# Patient Record
Sex: Female | Born: 1995 | Race: Black or African American | Hispanic: No | Marital: Single | Smoking: Current some day smoker
Health system: Southern US, Community
[De-identification: ages and names within clinical notes are randomized; demographics above are authoritative.]

## PROBLEM LIST (undated history)

## (undated) DIAGNOSIS — L0232 Furuncle of buttock: Secondary | ICD-10-CM

## (undated) HISTORY — PX: NO PAST SURGERIES: SHX2092

---
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
## 2017-01-20 ENCOUNTER — Ambulatory Visit (INDEPENDENT_AMBULATORY_CARE_PROVIDER_SITE_OTHER): Payer: Medicaid Other | Admitting: Family Medicine

## 2017-01-20 VITALS — BP 102/64 | HR 85 | Temp 98.1°F | Wt 158.0 lb

## 2017-01-20 DIAGNOSIS — Z23 Encounter for immunization: Secondary | ICD-10-CM

## 2017-01-20 DIAGNOSIS — O0001 Abdominal pregnancy with intrauterine pregnancy: Secondary | ICD-10-CM | POA: Diagnosis not present

## 2017-01-20 NOTE — Patient Instructions (Signed)

## 2017-01-20 NOTE — Progress Notes (Addendum)
Sara RoanLauren K Kelly is a 21 y.o. G1P0 at 6756w5d here for routine follow up.  She reports good fetal movement. No vaginal bleeding, LOF, contractions.  See flow sheet for details.  Fetus appears head down by bedside ultrasound  A/P: Pregnancy at 6156w5d.  Doing well.   Pregnancy issues include: THC use during pregnancy, denies any recent use.  She had an f/u anatomy scan which still had inadequate views of the ductal arch and orbit with no recommendation for follow up imaging  Infant feeding choice: breast and bottle Contraception choice: unsure, still thinking Infant circumcision desired: not applicable  Tdap was given today.  Preterm labor and fetal movement precautions reviewed. Safe sleep discussed. Follow up 2 weeks.

## 2017-02-04 ENCOUNTER — Encounter: Payer: Self-pay | Admitting: Internal Medicine

## 2017-02-04 NOTE — Progress Notes (Deleted)
Yvetta Josem KaufmannK Sarff is a 21 y.o. G***P*** at 4140w6d for routine follow up.  She reports***.  See flow sheet for details.  A/P: Pregnancy at 3840w6d.  Doing well.   Pregnancy issues include***  Infant feeding choice*** Contraception choice*** Infant circumcision desired {Response; yes/no/na:63}  Tdap{was/was not:19854::"was"} given today. GBS/GC/CZ testing {was/was not:19854::"was"} performed today.  Preterm labor precautions reviewed. Safe sleep discussed. Kick counts reviewed. Follow up 2 weeks.

## 2017-02-08 ENCOUNTER — Encounter: Payer: Self-pay | Admitting: Internal Medicine

## 2017-02-19 ENCOUNTER — Encounter: Payer: Medicaid Other | Admitting: Internal Medicine

## 2017-02-26 ENCOUNTER — Ambulatory Visit (INDEPENDENT_AMBULATORY_CARE_PROVIDER_SITE_OTHER): Payer: Medicaid Other | Admitting: Student

## 2017-02-26 ENCOUNTER — Encounter: Payer: Self-pay | Admitting: Student

## 2017-02-26 ENCOUNTER — Encounter: Payer: Medicaid Other | Admitting: Family Medicine

## 2017-02-26 ENCOUNTER — Other Ambulatory Visit (HOSPITAL_COMMUNITY)
Admission: RE | Admit: 2017-02-26 | Discharge: 2017-02-26 | Disposition: A | Discharge status: Home / Self Care | Payer: Medicaid Other | Source: Ambulatory Visit | Attending: Family Medicine | Admitting: Family Medicine

## 2017-02-26 VITALS — BP 102/58 | HR 85 | Temp 98.5°F | Ht 63.0 in | Wt 171.6 lb

## 2017-02-26 DIAGNOSIS — Z114 Encounter for screening for human immunodeficiency virus [HIV]: Secondary | ICD-10-CM

## 2017-02-26 DIAGNOSIS — Z3403 Encounter for supervision of normal first pregnancy, third trimester: Secondary | ICD-10-CM | POA: Diagnosis present

## 2017-02-26 DIAGNOSIS — Z3A38 38 weeks gestation of pregnancy: Secondary | ICD-10-CM | POA: Diagnosis not present

## 2017-02-26 DIAGNOSIS — Z113 Encounter for screening for infections with a predominantly sexual mode of transmission: Secondary | ICD-10-CM

## 2017-02-26 LAB — OB RESULTS CONSOLE GBS: GBS: NEGATIVE

## 2017-02-26 LAB — OB RESULTS CONSOLE GC/CHLAMYDIA: Gonorrhea: NEGATIVE

## 2017-02-26 NOTE — Progress Notes (Signed)
Sara Kelly is a 21 y.o. G1P0 at 3945w0d for routine follow up. Patient is here with her mother. She missed the last 2 appointments due to insurance issue. She denies pain or contraction, unusual vaginal discharge, gush or leak of fluid, vaginal bleeding, dysuria or fever. Fetal movement is present and is at baseline.   See flow sheet for details. Vitals:   02/26/17 1705  BP: (!) 102/58  Pulse: 85  Temp: 98.5 F (36.9 C)  TempSrc: Oral  SpO2: 98%  Weight: 171 lb 9.6 oz (77.8 kg)  Height: 5\' 3"  (1.6 m)  PELVIC:   Presentation vertex on [place]. Fundal height at 39 weeks External genitalia: normal without surrounding skin lesion, obvious discharge or bleeeding.  Speculum: pink vaginal mucosa, ruggated. No pooling or bleeding. Cervix visually closed and thick Manual: Cervix closed and thick  A/P: Pregnancy at 1445w0d.  Doing well.   Pregnancy issues include:  -Teen pregnancy  Infant feeding choice: breast and bottle Contraception choice: undecided Infant circumcision desired not applicable GBS/GC/CZ results were obtained today.   Patient did not have a second trimester labs including CBC, HIV & RPR. Unfortunately the lab is closed at this time. Advised patient to return on Monday for the lab draw. Future labs were ordered Term labor symptoms and general obstetric precautions including but not limited to vaginal bleeding, contractions, leaking of fluid and fetal movement were reviewed in detail with the patient. Follow up for prenatal visit in a week.

## 2017-02-26 NOTE — Patient Instructions (Signed)

## 2017-03-01 LAB — CERVICOVAGINAL ANCILLARY ONLY
CHLAMYDIA, DNA PROBE: NEGATIVE
NEISSERIA GONORRHEA: NEGATIVE
TRICH (WINDOWPATH): NEGATIVE

## 2017-03-02 ENCOUNTER — Other Ambulatory Visit: Payer: Medicaid Other

## 2017-03-02 DIAGNOSIS — Z3403 Encounter for supervision of normal first pregnancy, third trimester: Secondary | ICD-10-CM

## 2017-03-02 DIAGNOSIS — Z113 Encounter for screening for infections with a predominantly sexual mode of transmission: Secondary | ICD-10-CM

## 2017-03-02 DIAGNOSIS — Z114 Encounter for screening for human immunodeficiency virus [HIV]: Secondary | ICD-10-CM

## 2017-03-03 ENCOUNTER — Telehealth: Payer: Self-pay | Admitting: Student

## 2017-03-03 LAB — CBC
HEMATOCRIT: 30.8 % — AB (ref 34.0–46.6)
HEMOGLOBIN: 9.6 g/dL — AB (ref 11.1–15.9)
MCH: 29.9 pg (ref 26.6–33.0)
MCHC: 31.2 g/dL — AB (ref 31.5–35.7)
MCV: 96 fL (ref 79–97)
Platelets: 204 10*3/uL (ref 150–379)
RBC: 3.21 x10E6/uL — AB (ref 3.77–5.28)
RDW: 13.9 % (ref 12.3–15.4)
WBC: 5.9 10*3/uL (ref 3.4–10.8)

## 2017-03-03 LAB — RPR: RPR Ser Ql: NONREACTIVE

## 2017-03-03 LAB — HIV ANTIBODY (ROUTINE TESTING W REFLEX): HIV SCREEN 4TH GENERATION: NONREACTIVE

## 2017-03-03 NOTE — Telephone Encounter (Signed)
Called and discussed about her Hgb level, which is 9.6. She says she hasn't been taking iron. I encouraged her to restart taking it. Advised her to take with orange juice. Patient voices understanding and appreciates the call.

## 2017-03-05 ENCOUNTER — Encounter: Payer: Self-pay | Admitting: Student

## 2017-03-05 ENCOUNTER — Ambulatory Visit (INDEPENDENT_AMBULATORY_CARE_PROVIDER_SITE_OTHER): Payer: Medicaid Other | Admitting: Student

## 2017-03-05 VITALS — BP 108/72 | HR 102 | Temp 97.8°F | Wt 169.2 lb

## 2017-03-05 DIAGNOSIS — O99013 Anemia complicating pregnancy, third trimester: Secondary | ICD-10-CM

## 2017-03-05 DIAGNOSIS — Z3403 Encounter for supervision of normal first pregnancy, third trimester: Secondary | ICD-10-CM

## 2017-03-05 LAB — CULTURE, BETA STREP (GROUP B ONLY): Strep Gp B Culture: NEGATIVE

## 2017-03-05 NOTE — Patient Instructions (Signed)

## 2017-03-05 NOTE — Progress Notes (Signed)
Sara RoanLauren K Treinen is a 21 y.o. G1P0 at 445w0d for routine follow up.  She reports occasional contraction once early this morning and 5 minutes ago. They lasted 5 seconds. She reports fetal movement as usual. Denies vaginal bleeding, gush or leak of fluid, unusual discharge, dysuria or fever. She is taking prenatal vitamin and iron pills.   See flow sheet for details.  A/P: Pregnancy at 645w0d.  Doing well.   Pregnancy issues include: Anemia. Hemoglobin 9.6 about a week ago. -She resumed her iron pills  Infant feeding choice: Breast and bottle Contraception choice: Nexplanon Infant circumcision desired not applicable GBS/GC/CZ results were reviewed today. Labor symptoms and general obstetric precautions including but not limited to vaginal bleeding, contractions, leaking of fluid and fetal movement were reviewed in detail with the patient.

## 2017-03-12 ENCOUNTER — Ambulatory Visit (INDEPENDENT_AMBULATORY_CARE_PROVIDER_SITE_OTHER): Payer: Medicaid Other | Admitting: Student

## 2017-03-12 ENCOUNTER — Telehealth (HOSPITAL_COMMUNITY): Payer: Self-pay | Admitting: *Deleted

## 2017-03-12 ENCOUNTER — Encounter: Payer: Self-pay | Admitting: Student

## 2017-03-12 VITALS — BP 108/72 | HR 94 | Temp 98.4°F | Wt 171.0 lb

## 2017-03-12 DIAGNOSIS — O0001 Abdominal pregnancy with intrauterine pregnancy: Secondary | ICD-10-CM | POA: Diagnosis present

## 2017-03-12 LAB — POCT FERNING: Ferning, POC: NEGATIVE

## 2017-03-12 NOTE — Patient Instructions (Signed)
This nice to meet you again! We have scheduled your test at [facility] for Monday, 03/15/2017 at 10:40 AM. Please arrive early. Mild, please go to [facility] as soon as possible if you have frequent contractions, gush or leak of fluid, vaginal bleeding, baby not moving or other symptoms concerning to you.   Third Trimester of Pregnancy The third trimester is from week 29 through week 42, months 7 through 9. This trimester is when your unborn baby (fetus) is growing very fast. At the end of the ninth month, the unborn baby is about 20 inches in length. It weighs about 6-10 pounds. Follow these instructions at home:  Avoid all smoking, herbs, and alcohol. Avoid drugs not approved by your doctor.  Do not use any tobacco products, including cigarettes, chewing tobacco, and electronic cigarettes. If you need help quitting, ask your doctor. You may get counseling or other support to help you quit.  Only take medicine as told by your doctor. Some medicines are safe and some are not during pregnancy.  Exercise only as told by your doctor. Stop exercising if you start having cramps.  Eat regular, healthy meals.  Wear a good support bra if your breasts are tender.  Do not use hot tubs, steam rooms, or saunas.  Wear your seat belt when driving.  Avoid raw meat, uncooked cheese, and liter boxes and soil used by cats.  Take your prenatal vitamins.  Take 1500-2000 milligrams of calcium daily starting at the 20th week of pregnancy until you deliver your baby.  Try taking medicine that helps you poop (stool softener) as needed, and if your doctor approves. Eat more fiber by eating fresh fruit, vegetables, and whole grains. Drink enough fluids to keep your pee (urine) clear or pale yellow.  Take warm water baths (sitz baths) to soothe pain or discomfort caused by hemorrhoids. Use hemorrhoid cream if your doctor approves.  If you have puffy, bulging veins (varicose veins), wear support  hose. Raise (elevate) your feet for 15 minutes, 3-4 times a day. Limit salt in your diet.  Avoid heavy lifting, wear low heels, and sit up straight.  Rest with your legs raised if you have leg cramps or low back pain.  Visit your dentist if you have not gone during your pregnancy. Use a soft toothbrush to brush your teeth. Be gentle when you floss.  You can have sex (intercourse) unless your doctor tells you not to.  Do not travel far distances unless you must. Only do so with your doctor's approval.  Take prenatal classes.  Practice driving to the hospital.  Pack your hospital bag.  Prepare the baby's room.  Go to your doctor visits. Get help if:  You are not sure if you are in labor or if your water has broken.  You are dizzy.  You have mild cramps or pressure in your lower belly (abdominal).  You have a nagging pain in your belly area.  You continue to feel sick to your stomach (nauseous), throw up (vomit), or have watery poop (diarrhea).  You have bad smelling fluid coming from your vagina.  You have pain with peeing (urination). Get help right away if:  You have a fever.  You are leaking fluid from your vagina.  You are spotting or bleeding from your vagina.  You have severe belly cramping or pain.  You lose or gain weight rapidly.  You have trouble catching your breath and have chest pain.  You notice sudden or extreme  puffiness (swelling) of your face, hands, ankles, feet, or legs.  You have not felt the baby move in over an hour.  You have severe headaches that do not go away with medicine.  You have vision changes. This information is not intended to replace advice given to you by your health care provider. Make sure you discuss any questions you have with your health care provider. Document Released: 10/07/2009 Document Revised: 12/19/2015 Document Reviewed: 09/13/2012 Elsevier Interactive Patient Education  2017 [employer].

## 2017-03-12 NOTE — Progress Notes (Signed)
Sara Kelly is a 21 y.o. G1P0 at 102w0d for routine follow up.  She reports that fetal movement is as usual. Denies cramping pain. She reports some whitish discharge. Denies itching or odor to it. Unsure about leakage of fluid. Denies vaginal bleeding, dysuria or fever. Eating and drinking as usual. Denies headache or vision changes.   See flow sheet for details. Vitals:   03/12/17 1045  BP: 108/72  Pulse: 94  Temp: 98.4 F (36.9 C)  Weight: 171 lb (77.6 kg)  GEN: appears well, no apparent distress. Eyes: conjunctiva without injection, sclera anicteric CVS: RRR, nl s1 & s2, no murmurs, no edema RESP: no IWOB Pelvic exam:  Inspection: noted some whitish discharge from vaginal. No lesion Speculum without pooling or bleeding but whitish discharge from cervix.  Cervical exam: cervix about fingertip and thick.  SKIN: no apparent skin lesion ENDO: negative thyromegally PSYCH: euthymic mood with congruent affect   A/P: Pregnancy at [redacted]w[redacted]d.  Doing well.   Pregnancy issues include:  Teen pregnancy Anemia in pregnancy: taking iron and prenatal vitamin diligently.   Infant feeding choice: Breast and bottle Contraception choice: Nexplanon Infant circumcision desired not applicable GBS/GC/CZ results  were reviewed today.   Labor precautions reviewed. Kick counts reviewed. Twice weekly testing scheduled.  Induction scheduled for 41+ weeks on 03/20/2017. She will be [redacted]w[redacted]d. This is the soonest available.

## 2017-03-12 NOTE — Telephone Encounter (Signed)
Preadmission screen  

## 2017-03-14 ENCOUNTER — Inpatient Hospital Stay (HOSPITAL_COMMUNITY)
Admission: AD | Admit: 2017-03-14 | Discharge: 2017-03-16 | DRG: 775 | Disposition: A | Discharge status: Home / Self Care | Payer: Medicaid Other | Source: Ambulatory Visit | Attending: Obstetrics and Gynecology | Admitting: Obstetrics and Gynecology

## 2017-03-14 ENCOUNTER — Inpatient Hospital Stay (HOSPITAL_COMMUNITY): Payer: Medicaid Other | Admitting: Anesthesiology

## 2017-03-14 ENCOUNTER — Encounter (HOSPITAL_COMMUNITY): Payer: Self-pay

## 2017-03-14 DIAGNOSIS — Z880 Allergy status to penicillin: Secondary | ICD-10-CM | POA: Diagnosis not present

## 2017-03-14 DIAGNOSIS — D649 Anemia, unspecified: Secondary | ICD-10-CM | POA: Diagnosis present

## 2017-03-14 DIAGNOSIS — Z3A40 40 weeks gestation of pregnancy: Secondary | ICD-10-CM

## 2017-03-14 DIAGNOSIS — O9902 Anemia complicating childbirth: Secondary | ICD-10-CM | POA: Diagnosis present

## 2017-03-14 DIAGNOSIS — O4292 Full-term premature rupture of membranes, unspecified as to length of time between rupture and onset of labor: Secondary | ICD-10-CM | POA: Diagnosis present

## 2017-03-14 DIAGNOSIS — Z87891 Personal history of nicotine dependence: Secondary | ICD-10-CM | POA: Diagnosis not present

## 2017-03-14 LAB — CBC
HCT: 28.8 % — ABNORMAL LOW (ref 36.0–46.0)
Hemoglobin: 9.5 g/dL — ABNORMAL LOW (ref 12.0–15.0)
MCH: 30.6 pg (ref 26.0–34.0)
MCHC: 33 g/dL (ref 30.0–36.0)
MCV: 92.9 fL (ref 78.0–100.0)
PLATELETS: 177 10*3/uL (ref 150–400)
RBC: 3.1 MIL/uL — AB (ref 3.87–5.11)
RDW: 14.8 % (ref 11.5–15.5)
WBC: 8.2 10*3/uL (ref 4.0–10.5)

## 2017-03-14 LAB — TYPE AND SCREEN
ABO/RH(D): O POS
ANTIBODY SCREEN: NEGATIVE

## 2017-03-14 LAB — POCT FERN TEST: POCT Fern Test: POSITIVE

## 2017-03-14 LAB — ABO/RH: ABO/RH(D): O POS

## 2017-03-14 LAB — RPR: RPR: NONREACTIVE

## 2017-03-14 MED ORDER — LACTATED RINGERS IV SOLN: 500.0000 mL | Freq: Once | INTRAVENOUS | Status: DC

## 2017-03-14 MED ORDER — LACTATED RINGERS IV SOLN: 500.0000 mL | INTRAVENOUS | Status: DC | PRN

## 2017-03-14 MED ORDER — DIPHENHYDRAMINE HCL 50 MG/ML IJ SOLN: 12.5000 mg | INTRAMUSCULAR | Status: DC | PRN

## 2017-03-14 MED ORDER — FENTANYL CITRATE (PF) 100 MCG/2ML IJ SOLN: 50.0000 ug | INTRAMUSCULAR | Status: DC | PRN

## 2017-03-14 MED ORDER — LIDOCAINE HCL (PF) 1 % IJ SOLN
30.0000 mL | INTRAMUSCULAR | Status: DC | PRN
  Filled 2017-03-14: qty 30

## 2017-03-14 MED ORDER — PHENYLEPHRINE 40 MCG/ML (10ML) SYRINGE FOR IV PUSH (FOR BLOOD PRESSURE SUPPORT)
80.0000 ug | PREFILLED_SYRINGE | INTRAVENOUS | Status: DC | PRN
  Filled 2017-03-14: qty 5

## 2017-03-14 MED ORDER — ONDANSETRON HCL 4 MG/2ML IJ SOLN: 4.0000 mg | Freq: Four times a day (QID) | INTRAMUSCULAR | Status: DC | PRN

## 2017-03-14 MED ORDER — OXYTOCIN 40 UNITS IN LACTATED RINGERS INFUSION - SIMPLE MED
2.5000 [IU]/h | INTRAVENOUS | Status: DC
  Administered 2017-03-14: 2.5 [IU]/h via INTRAVENOUS
  Filled 2017-03-14: qty 1000

## 2017-03-14 MED ORDER — EPHEDRINE 5 MG/ML INJ
10.0000 mg | INTRAVENOUS | Status: DC | PRN
  Filled 2017-03-14: qty 2

## 2017-03-14 MED ORDER — FENTANYL 2.5 MCG/ML BUPIVACAINE 1/10 % EPIDURAL INFUSION (WH - ANES): 14.0000 mL/h | INTRAMUSCULAR | Status: DC | PRN

## 2017-03-14 MED ORDER — SOD CITRATE-CITRIC ACID 500-334 MG/5ML PO SOLN: 30.0000 mL | ORAL | Status: DC | PRN

## 2017-03-14 MED ORDER — TERBUTALINE SULFATE 1 MG/ML IJ SOLN
0.2500 mg | Freq: Once | INTRAMUSCULAR | Status: DC | PRN
  Filled 2017-03-14: qty 1

## 2017-03-14 MED ORDER — FLEET ENEMA 7-19 GM/118ML RE ENEM: 1.0000 | ENEMA | RECTAL | Status: DC | PRN

## 2017-03-14 MED ORDER — FENTANYL 2.5 MCG/ML BUPIVACAINE 1/10 % EPIDURAL INFUSION (WH - ANES)
14.0000 mL/h | INTRAMUSCULAR | Status: DC | PRN
  Administered 2017-03-14: 14 mL/h via EPIDURAL
  Filled 2017-03-14: qty 100

## 2017-03-14 MED ORDER — OXYTOCIN BOLUS FROM INFUSION
500.0000 mL | Freq: Once | INTRAVENOUS | Status: AC
  Administered 2017-03-14: 500 mL via INTRAVENOUS

## 2017-03-14 MED ORDER — LACTATED RINGERS IV SOLN
INTRAVENOUS | Status: DC
  Administered 2017-03-14: 17:00:00 via INTRAVENOUS
  Administered 2017-03-14: 125 mL/h via INTRAVENOUS
  Administered 2017-03-14: 22:00:00 via INTRAVENOUS

## 2017-03-14 MED ORDER — PHENYLEPHRINE 40 MCG/ML (10ML) SYRINGE FOR IV PUSH (FOR BLOOD PRESSURE SUPPORT)
80.0000 ug | PREFILLED_SYRINGE | INTRAVENOUS | Status: DC | PRN
  Filled 2017-03-14: qty 10
  Filled 2017-03-14: qty 5

## 2017-03-14 MED ORDER — ACETAMINOPHEN 325 MG PO TABS: 650.0000 mg | ORAL_TABLET | ORAL | Status: DC | PRN

## 2017-03-14 MED ORDER — OXYCODONE-ACETAMINOPHEN 5-325 MG PO TABS: 2.0000 | ORAL_TABLET | ORAL | Status: DC | PRN

## 2017-03-14 MED ORDER — LIDOCAINE HCL (PF) 1 % IJ SOLN
INTRAMUSCULAR | Status: DC | PRN
  Administered 2017-03-14 (×2): 4 mL

## 2017-03-14 MED ORDER — OXYCODONE-ACETAMINOPHEN 5-325 MG PO TABS: 1.0000 | ORAL_TABLET | ORAL | Status: DC | PRN

## 2017-03-14 MED ORDER — OXYTOCIN 40 UNITS IN LACTATED RINGERS INFUSION - SIMPLE MED
1.0000 m[IU]/min | INTRAVENOUS | Status: DC
  Administered 2017-03-14: 2 m[IU]/min via INTRAVENOUS

## 2017-03-14 NOTE — H&P (Signed)
LABOR AND DELIVERY ADMISSION HISTORY AND PHYSICAL NOTE  Sara Kelly is a 21 y.o. female G1P0 with IUP at [redacted]w[redacted]d by LMP presenting for SROM at 8pm on 8/18.   She reports positive fetal movement and leaking fluid. She denies vaginal bleeding. She denies feeling contractions. Feels baby kicking. No lower abdominal pressure.   Prenatal History/Complications: Anemia Teen pregnancy  Past Medical History: Past Medical History:  Diagnosis Date  . Boil of buttock     Past Surgical History: Past Surgical History:  Procedure Laterality Date  . NO PAST SURGERIES      Obstetrical History: OB History    Gravida Para Term Preterm AB Living   1             SAB TAB Ectopic Multiple Live Births                  Social History: Social History   Social History  . Marital status: Single    Spouse name: N/A  . Number of children: N/A  . Years of education: N/A   Social History Main Topics  . Smoking status: Former Smoker    Types: Cigars    Quit date: 07/27/2016  . Smokeless tobacco: Never Used  . Alcohol use No     Comment: quit with +preg  . Drug use: No  . Sexual activity: Yes   Other Topics Concern  . None   Social History Narrative  . None    Family History: Family History  Problem Relation Age of Onset  . Diabetes Sister     Allergies: Allergies  Allergen Reactions  . Amoxicillin Hives  . Penicillins Hives    Prescriptions Prior to Admission  Medication Sig Dispense Refill Last Dose  . ferrous sulfate 325 (65 FE) MG tablet Take 1 tablet (325 mg total) by mouth daily. 90 tablet 3 Past Week at Unknown time  . Prenatal Vit-Fe Fumarate-FA (PRENATAL VITAMIN) 27-0.8 MG TABS Take 1 tablet by mouth daily. 30 tablet 11 03/13/2017 at Unknown time  . Doxylamine-Pyridoxine 10-10 MG TBEC Take 2 tablets by mouth at bedtime. For 2 nights. If symptoms persist increased dosing to 1 tablet in the morning and 2 tablets at night. (Patient not taking: Reported on 09/08/2016) 60  tablet 2 More than a month at Unknown time     Review of Systems   All systems reviewed and negative except as stated in HPI  Blood pressure 117/74, pulse (!) 101, temperature 98.6 F (37 C), temperature source Oral, resp. rate 16, height 5\' 3"  (1.6 m), weight 79.4 kg (175 lb), last menstrual period 06/05/2016, SpO2 98 %. General appearance: alert, cooperative and no distress Lungs: no respiratory distress Heart: regular rate Abdomen: soft, non-tender Extremities: No calf swelling or tenderness Presentation: cephalic by exam Fetal monitoring: baseline 150, moderate variability, accels present no decels Uterine activity: irritable  Dilation: 2.5 Effacement (%): 40 Station: -3 Exam by:: Camelia Eng RN   Prenatal labs: ABO, Rh: O/POS/-- (01/11 1504) Antibody: NEG (01/11 1504) Rubella: immune RPR: Non Reactive (08/07 0844)  HBsAg: NEGATIVE (01/11 1504)  HIV: NONREACTIVE (01/11 1504)  GBS: Negative (08/03 0000)  Genetic screening:  normal Anatomy US: normal  Prenatal Transfer Tool  Maternal Diabetes: No Genetic Screening: Normal Maternal Ultrasounds/Referrals: Normal Fetal Ultrasounds or other Referrals:  None Maternal Substance Abuse:  Yes:  Type: Smoker Significant Maternal Medications:  None Significant Maternal Lab Results: None  Results for orders placed or performed during the hospital encounter of 03/14/17 (  from the past 24 hour(s))  [facility] Time: 03/14/17  1:05 AM  Result Value Ref Range   POCT Fern Test Positive = ruptured amniotic membanes     Patient Active Problem List   Diagnosis Date Noted  . Abdominal pregnancy with intrauterine pregnancy 08/24/2016  . Amenorrhea 07/24/2016  . Contraceptive management 05/10/2013  . Acne comedone 07/08/2011    Assessment: KRISTILYN MCGIFFIN is a 21 y.o. G1P0 at [redacted]w[redacted]d here for SOL with SROM  #Labor: progressing normally, will determine need for augmentation in future #Pain: Would like to try for  natural birth #FWB: Cat 1 tracing #ID:  GBS negative #MOF: both #MOC: nexplanon #Circ:  n/a  Tillman Sers, DO PGY-2 8/19/20181:12 AM   I confirm that I have verified the information documented in the resident's note and that I have also personally reperformed the physical exam and all medical decision making activities.   Luna Kitchens CNM

## 2017-03-14 NOTE — Progress Notes (Signed)
LABOR PROGRESS NOTE  Sara Kelly is a 21 y.o. G1P0 at [redacted]w[redacted]d  admitted for PROM at 8pm last night.  Subjective: Doing well. Felling contractions. Currently on the ball.   Objective: BP 121/73   Pulse 87   Temp 98.3 F (36.8 C) (Oral)   Resp 18   Ht 5\' 3"  (1.6 m)   Wt 174 lb (78.9 kg)   LMP 06/05/2016 (Exact Date)   SpO2 100%   BMI 30.82 kg/m  or  Vitals:   03/14/17 1301 03/14/17 1331 03/14/17 1401 03/14/17 1410  BP: 118/72 122/69 121/73   Pulse: 88 80 87   Resp:  18    Temp:    98.3 F (36.8 C)  TempSrc:    Oral  SpO2:      Weight:      Height:        Last SVE: Dilation: 3 Effacement (%): 60 Station: -3 Presentation: Vertex Exam by:: M.Lee, RNC-OB FHT: baseline rate 130, moderate varibility, +acel, no decel Toco: ctx q 1-4 min  Labs: Lab Results  Component Value Date   WBC 8.2 03/14/2017   HGB 9.5 (L) 03/14/2017   HCT 28.8 (L) 03/14/2017   MCV 92.9 03/14/2017   PLT 177 03/14/2017    Patient Active Problem List   Diagnosis Date Noted  . Normal labor 03/14/2017  . Abdominal pregnancy with intrauterine pregnancy 08/24/2016  . Amenorrhea 07/24/2016  . Contraceptive management 05/10/2013  . Acne comedone 07/08/2011    Assessment / Plan: 21 y.o. G1P0 at [redacted]w[redacted]d here for IOL for PROM  Labor: Continue to titrate Pitocin, currently at 81mU/min Fetal Wellbeing:  Cat I Pain Control:  Natural Anticipated MOD:  SVD  Frederik Pear, MD 03/14/2017, 2:31 PM

## 2017-03-14 NOTE — Anesthesia Pain Management Evaluation Note (Signed)
  CRNA Pain Management Visit Note  Patient: Sara Kelly, 21 y.o., female  "Hello I am a member of the anesthesia team at [facility]. We have an anesthesia team available at all times to provide care throughout the hospital, including epidural management and anesthesia for C-section. I don't know your plan for the delivery whether it a natural birth, water birth, IV sedation, nitrous supplementation, doula or epidural, but we want to meet your pain goals."   1.Was your pain managed to your expectations on prior hospitalizations?   No prior hospitalizations  2.What is your expectation for pain management during this hospitalization?     Epidural  3.How can we help you reach that goal? Epidural currently being placed by ANMD  Record the patient's initial score and the patient's pain goal.   Pain: 10  Pain Goal: 10 [facility] wants you to be able to say your pain was always managed very well.  Tam Savoia 03/14/2017

## 2017-03-14 NOTE — Anesthesia Preprocedure Evaluation (Signed)
Anesthesia Evaluation  Patient identified by MRN, date of birth, ID band Patient awake    Reviewed: Allergy & Precautions, Patient's Chart, lab work & pertinent test results  Airway Mallampati: II  TM Distance: >3 FB Neck ROM: Full    Dental no notable dental hx.    Pulmonary former smoker,    Pulmonary exam normal breath sounds clear to auscultation       Cardiovascular negative cardio ROS Normal cardiovascular exam Rhythm:Regular Rate:Normal     Neuro/Psych negative neurological ROS  negative psych ROS   GI/Hepatic negative GI ROS, Neg liver ROS,   Endo/Other  negative endocrine ROS  Renal/GU negative Renal ROS     Musculoskeletal negative musculoskeletal ROS (+)   Abdominal   Peds  Hematology negative hematology ROS (+)   Anesthesia Other Findings   Reproductive/Obstetrics (+) Pregnancy                             Anesthesia Physical Anesthesia Plan  ASA: II  Anesthesia Plan: Epidural   Post-op Pain Management:    Induction:   PONV Risk Score and Plan:   Airway Management Planned:   Additional Equipment:   Intra-op Plan:   Post-operative Plan:   Informed Consent: I have reviewed the patients History and Physical, chart, labs and discussed the procedure including the risks, benefits and alternatives for the proposed anesthesia with the patient or authorized representative who has indicated his/her understanding and acceptance.       Plan Discussed with:   Anesthesia Plan Comments:         Anesthesia Quick Evaluation  

## 2017-03-14 NOTE — MAU Note (Signed)
Pt c/o contractions that started earlier today. States around 2030-2100, she noticed a trickle of watery fluid that she has continued to felt it. Also noticed a small amount of vaginal bleeding. Does not have any now. Reports good fetal movement.

## 2017-03-14 NOTE — Progress Notes (Addendum)
Patient ID: Sara Kelly, female   DOB: [birth year], 21 y.o.   MRN: [id number]  Comfortable w/ epidural; had episode of prolonged decel at approx 2015 at which time Pit was stopped  BP 113/69, other VSS FHR 130s, decreased variability, +10x10s, Cat 1 Ctx q 2-5 mins, irreg at times Cx 9/C/0  IUP@term  Active labor FHR changes- resolved  Will start Pit @ 13mu/min and titrate prn to keep ctx reg Anticipate beginning pushing within next couple of hours  Cam Hai CNM 03/14/2017 10:14 PM

## 2017-03-14 NOTE — Progress Notes (Signed)
LABOR PROGRESS NOTE  Sara Kelly is a 21 y.o. G1P0 at [redacted]w[redacted]d  admitted for PROM at 8pm last night.  Subjective: Doing well. Felling contractions stronger. Would like to try nitrous oxide for pain control  Objective: BP (!) 113/58   Pulse 97   Temp (!) 97.5 F (36.4 C) (Oral)   Resp 20   Ht 5\' 3"  (1.6 m)   Wt 174 lb (78.9 kg)   LMP 06/05/2016 (Exact Date)   SpO2 100%   BMI 30.82 kg/m  or  Vitals:   03/14/17 1531 03/14/17 1601 03/14/17 1631 03/14/17 1801  BP: 120/68 101/72 127/60 (!) 113/58  Pulse: 89 100 94 97  Resp:  20  20  Temp:  98 F (36.7 C)  (!) 97.5 F (36.4 C)  TempSrc:  Oral  Oral  SpO2:      Weight:      Height:       SVE: 6/80/-2. AROM of forebag, cler fluid FHT: baseline rate 130, moderate varibility, +acel, no decel Toco: ctx q 1-5 min  Labs: Lab Results  Component Value Date   WBC 8.2 03/14/2017   HGB 9.5 (L) 03/14/2017   HCT 28.8 (L) 03/14/2017   MCV 92.9 03/14/2017   PLT 177 03/14/2017    Patient Active Problem List   Diagnosis Date Noted  . Normal labor 03/14/2017  . Abdominal pregnancy with intrauterine pregnancy 08/24/2016  . Amenorrhea 07/24/2016  . Contraceptive management 05/10/2013  . Acne comedone 07/08/2011    Assessment / Plan: 22 y.o. G1P0 at [redacted]w[redacted]d here for IOL for PROM  Labor: Continue to titrate Pitocin, currently at 33mU/min Fetal Wellbeing:  Cat I Pain Control:  Nitrous oxide. Not planning on epidural Anticipated MOD:  SVD  Frederik Pear, MD 03/14/2017, 6:21 PM

## 2017-03-14 NOTE — Progress Notes (Signed)
Patient admitted for PROM overnight.  Irregular contractions.  SVE 3/50/-3  Will start Pitocin for labor augmentation  Raynelle Fanning P. Degele, MD OB Fellow

## 2017-03-15 ENCOUNTER — Encounter (HOSPITAL_COMMUNITY): Payer: Self-pay

## 2017-03-15 ENCOUNTER — Other Ambulatory Visit: Payer: Medicaid Other

## 2017-03-15 MED ORDER — ACETAMINOPHEN 325 MG PO TABS: 650.0000 mg | ORAL_TABLET | ORAL | Status: DC | PRN

## 2017-03-15 MED ORDER — ONDANSETRON HCL 4 MG PO TABS: 4.0000 mg | ORAL_TABLET | ORAL | Status: DC | PRN

## 2017-03-15 MED ORDER — TETANUS-DIPHTH-ACELL PERTUSSIS 5-2.5-18.5 LF-MCG/0.5 IM SUSP: 0.5000 mL | Freq: Once | INTRAMUSCULAR | Status: DC

## 2017-03-15 MED ORDER — PRENATAL MULTIVITAMIN CH
1.0000 | ORAL_TABLET | Freq: Every day | ORAL | Status: DC
  Administered 2017-03-15 – 2017-03-16 (×2): 1 via ORAL
  Filled 2017-03-15 (×2): qty 1

## 2017-03-15 MED ORDER — SIMETHICONE 80 MG PO CHEW: 80.0000 mg | CHEWABLE_TABLET | ORAL | Status: DC | PRN

## 2017-03-15 MED ORDER — BENZOCAINE-MENTHOL 20-0.5 % EX AERO: 1.0000 "application " | INHALATION_SPRAY | CUTANEOUS | Status: DC | PRN

## 2017-03-15 MED ORDER — IBUPROFEN 600 MG PO TABS: 600.0000 mg | ORAL_TABLET | Freq: Four times a day (QID) | ORAL | 0 refills | Status: DC

## 2017-03-15 MED ORDER — DIPHENHYDRAMINE HCL 25 MG PO CAPS: 25.0000 mg | ORAL_CAPSULE | Freq: Four times a day (QID) | ORAL | Status: DC | PRN

## 2017-03-15 MED ORDER — IBUPROFEN 600 MG PO TABS
600.0000 mg | ORAL_TABLET | Freq: Four times a day (QID) | ORAL | Status: DC
  Administered 2017-03-15 – 2017-03-16 (×7): 600 mg via ORAL
  Filled 2017-03-15 (×7): qty 1

## 2017-03-15 MED ORDER — WITCH HAZEL-GLYCERIN EX PADS: 1.0000 "application " | MEDICATED_PAD | CUTANEOUS | Status: DC | PRN

## 2017-03-15 MED ORDER — ZOLPIDEM TARTRATE 5 MG PO TABS: 5.0000 mg | ORAL_TABLET | Freq: Every evening | ORAL | Status: DC | PRN

## 2017-03-15 MED ORDER — SENNOSIDES-DOCUSATE SODIUM 8.6-50 MG PO TABS
2.0000 | ORAL_TABLET | ORAL | Status: DC
  Administered 2017-03-15 (×2): 2 via ORAL
  Filled 2017-03-15 (×2): qty 2

## 2017-03-15 MED ORDER — ONDANSETRON HCL 4 MG/2ML IJ SOLN: 4.0000 mg | INTRAMUSCULAR | Status: DC | PRN

## 2017-03-15 MED ORDER — COCONUT OIL OIL: 1.0000 "application " | TOPICAL_OIL | Status: DC | PRN

## 2017-03-15 MED ORDER — DIBUCAINE 1 % RE OINT: 1.0000 "application " | TOPICAL_OINTMENT | RECTAL | Status: DC | PRN

## 2017-03-15 MED ORDER — OXYCODONE HCL 5 MG PO TABS: 5.0000 mg | ORAL_TABLET | ORAL | Status: DC | PRN

## 2017-03-15 NOTE — Discharge Instructions (Signed)

## 2017-03-15 NOTE — Progress Notes (Signed)
UR chart review completed.  

## 2017-03-15 NOTE — Progress Notes (Signed)
Post Partum Day #1 Subjective: no complaints, up ad lib, voiding, tolerating PO and pumping  Objective: Blood pressure 122/64, pulse 81, temperature 98.6 F (37 C), temperature source Oral, resp. rate 18, height 5\' 3"  (1.6 m), weight 78.9 kg (174 lb), last menstrual period 06/05/2016, SpO2 100 %, unknown if currently breastfeeding.  Physical Exam:  General: alert Lochia: appropriate Uterine Fundus: firm and appropriately tender at U DVT Evaluation: No evidence of DVT seen on physical exam.   Recent Labs  03/14/17 0116  HGB 9.5*  HCT 28.8*    Assessment/Plan: Plan for discharge tomorrow  Pumping Nexplanon, in patient   LOS: 1 day   Sara Kelly C Sara Kelly 03/15/2017, 7:04 AM

## 2017-03-15 NOTE — Anesthesia Postprocedure Evaluation (Signed)
Anesthesia Post Note  Patient: Sara Kelly  Procedure(s) Performed: * No procedures listed *     Patient location during evaluation: Mother Baby Anesthesia Type: Epidural Level of consciousness: awake, awake and alert and oriented Pain management: pain level controlled Vital Signs Assessment: post-procedure vital signs reviewed and stable Respiratory status: spontaneous breathing, nonlabored ventilation and respiratory function stable Cardiovascular status: stable Postop Assessment: no headache, no backache, patient able to bend at knees, no signs of nausea or vomiting and adequate PO intake Anesthetic complications: no    Last Vitals:  Vitals:   03/15/17 0109 03/15/17 0540  BP: 124/61 122/64  Pulse: 88 81  Resp: 18 18  Temp: 37.1 C 37 C  SpO2: 99% 100%    Last Pain:  Vitals:   03/15/17 0709  TempSrc:   PainSc: 0-No pain   Pain Goal:                 [name]

## 2017-03-15 NOTE — Progress Notes (Signed)
CSW received consult for hx of marijuana use.  Referral was screened out due to the following: °~MOB had no documented substance use after initial prenatal visit/+UPT. °~MOB had no positive drug screens after initial prenatal visit/+UPT. °~Baby's UDS is negative. ° °Please consult CSW if current concerns arise or by MOB's request. ° °CSW will monitor CDS results and make report to Child Protective Services if warranted. ° °Rieley Khalsa Boyd-Gilyard, MSW, LCSW °Clinical Social Work °[phone] ° °

## 2017-03-15 NOTE — Lactation Note (Addendum)
This note was copied from a baby's chart. Lactation Consultation Note Sara Kelly mom has decided to pump and bottle feed. Mom has easily expressed colostrum. Mom stated she doesn't like to put baby to breast and has wanted to pump and bottle feed. Mom stated if she didn't have enough she was going to give formula. Mom doesn't have a pump at home. Mom has WIC. Mom will be returning to school and will need to pump at school. Mom stated she has a friend at school and she pumps w/o trouble.  Mom has coned shaped breast w/everted nipple bulbous areola. Hand expression taught w/28ml collected. Discussed pumping and bottle feeding. Supply and demand, the importance of timely pumping, paying attention to her breast, breast massaging, milk supply, milk storage, and I&O of baby. STS still important to help milk supply.  Mom shown how to use DEBP & how to disassemble, clean, & reassemble parts. Mom knows to pump q3h for 15-20 min.  Mom encouraged to feed baby 8-12 times/24 hours and with feeding cues. Feeding  Amount according to hours of age information sheet given and reviewed.  storage bottles given. WH/LC brochure given w/resources, support groups and LC services. Patient Name: Sara Kelly Today's Date: 03/15/2017 Reason for consult: Initial assessment   Maternal Data    Feeding Feeding Type: Breast Milk Length of feed: 10 min  LATCH Score       Type of Nipple: Everted at rest and after stimulation  Comfort (Breast/Nipple): Soft / non-tender        Interventions Interventions: Breast feeding basics reviewed;Support pillows;Position options;Skin to skin;Expressed milk;Breast massage;Hand express;DEBP;Breast compression  Lactation Tools Discussed/Used Tools: Pump;Bottle Breast pump type: Double-Electric Breast Pump WIC Program: Yes Pump Review: Setup, frequency, and cleaning;Milk Storage Initiated by:: Peri Jefferson RN IBCLC  Date initiated:: 03/15/17   Consult Status Consult  Status: Follow-up Date: 03/16/17 Follow-up type: In-patient    Charyl Dancer 03/15/2017, 6:51 AM

## 2017-03-16 MED ORDER — LIDOCAINE HCL 1 % IJ SOLN
0.0000 mL | Freq: Once | INTRAMUSCULAR | Status: AC | PRN
  Administered 2017-03-16: 20 mL via INTRADERMAL
  Filled 2017-03-16: qty 20

## 2017-03-16 MED ORDER — ETONOGESTREL 68 MG ~~LOC~~ IMPL
68.0000 mg | DRUG_IMPLANT | Freq: Once | SUBCUTANEOUS | Status: AC
  Administered 2017-03-16: 68 mg via SUBCUTANEOUS
  Filled 2017-03-16: qty 1

## 2017-03-16 NOTE — Discharge Summary (Signed)
OB Discharge Summary  Patient Name: Sara Kelly DOB: 1995/09/04 MRN: [id number]  Date of admission: 03/14/2017 Delivering MD: Cam Hai D   Date of discharge: 03/16/2017  Admitting diagnosis: 40wks, contractions, blood sited Intrauterine pregnancy: [redacted]w[redacted]d     Secondary diagnosis:Active Problems:   Normal labor  Additional problems:teen pregnancy     Augmentation: AROM  Complications: None  Hospital course:  Onset of Labor With Vaginal Delivery     21 y.o. yo G1P1001 at [redacted]w[redacted]d was admitted in Active Labor on 03/14/2017. Patient had an uncomplicated labor course as follows:  Membrane Rupture Time/Date: 8:30 PM ,03/13/2017   Intrapartum Procedures: Episiotomy: None [1]                                         Lacerations:  None [1]  Patient had a delivery of a Viable infant. 03/14/2017  Information for the patient's newborn:  Twanda, Stakes [[id number]]  Delivery Method: Vaginal, Spontaneous Delivery (Filed from Delivery Summary)    Pateint had an uncomplicated postpartum course.  She is ambulating, tolerating a regular diet, passing flatus, and urinating well. Patient is discharged home in stable condition on 03/16/17.   Physical exam  Vitals:   03/15/17 0540 03/15/17 1145 03/15/17 2000 03/16/17 0556  BP: 122/64 100/61 113/72 121/62  Pulse: 81 83 85 70  Resp: 18 16 20    Temp: 98.6 F (37 C) 98.4 F (36.9 C) 98 F (36.7 C) 98.2 F (36.8 C)  TempSrc: Oral Oral Oral Oral  SpO2: 100%     Weight:      Height:       General: alert Lochia: appropriate Uterine Fundus: firm Incision: N/A DVT Evaluation: No evidence of DVT seen on physical exam. Labs: Lab Results  Component Value Date   WBC 8.2 03/14/2017   HGB 9.5 (L) 03/14/2017   HCT 28.8 (L) 03/14/2017   MCV 92.9 03/14/2017   PLT 177 03/14/2017   No flowsheet data found.  Discharge instruction: per After Visit Summary and "Baby and Me Booklet".  After Visit Meds:  Allergies as of 03/16/2017    Reactions   Amoxicillin Hives   Has patient had a PCN reaction causing immediate rash, facial/tongue/throat swelling, SOB or lightheadedness with hypotension: No Has patient had a PCN reaction causing severe rash involving mucus membranes or skin necrosis: No Has patient had a PCN reaction that required hospitalization: No Has patient had a PCN reaction occurring within the last 10 years: Yes If all of the above answers are "NO", then may proceed with Cephalosporin use.   Penicillins Hives   Has patient had a PCN reaction causing immediate rash, facial/tongue/throat swelling, SOB or lightheadedness with hypotension: No Has patient had a PCN reaction causing severe rash involving mucus membranes or skin necrosis: No Has patient had a PCN reaction that required hospitalization: No Has patient had a PCN reaction occurring within the last 10 years: Yes If all of the above answers are "NO", then may proceed with Cephalosporin use.      Medication List    TAKE these medications   ferrous sulfate 325 (65 FE) MG tablet Take 1 tablet (325 mg total) by mouth daily.   ibuprofen 600 MG tablet Commonly known as:  ADVIL,MOTRIN Take 1 tablet (600 mg total) by mouth every 6 (six) hours.   Prenatal Vitamin 27-0.8 MG Tabs Take 1  tablet by mouth daily.       Diet: routine diet  Activity: Advance as tolerated. Pelvic rest for 6 weeks.   Outpatient follow up:4 weeks Follow up Appt:No future appointments. Follow up visit: No Follow-up on file.  Postpartum contraception: Nexpanon  Newborn Data: Live born female  Birth Weight: 7 lb 8.8 oz (3425 g) APGAR: 9, 9  Baby Feeding: Bottle and Breast Disposition:awaiting Peds to determine if baby is going home today   03/16/2017 Allie Bossier, MD

## 2017-03-16 NOTE — Lactation Note (Signed)
This note was copied from a baby's chart. Lactation Consultation Note  Patient Name: Sara Kelly [id number] Date: 03/16/2017 Reason for consult: Follow-up assessment Baby at 35 hr of life. Baby is not going home today, she is staying for photo therapy. MGM bought a pump for home use, so mom is declining the hospital rental and the [facility] loaners. Mom was on her phone for the entire visit and made little eye contact. Mom reports using the DEBP every 20 minutes since last night because she was leaking and the baby was crying. She stated her boyfriend has been gone and she does not know what to with the baby other than offer her bottle every time she cries. Discussed baby behavior, infant soothing, feeding frequency, baby belly size, voids, wt loss, breast changes, and nipple care. Encouraged mom to pump 8-12x/24hr.    Maternal Data    Feeding Feeding Type: Breast Milk Nipple Type: Slow - flow Length of feed: 15 min  LATCH Score                   Interventions    Lactation Tools Discussed/Used     Consult Status Consult Status: Follow-up Date: 03/17/17 Follow-up type: In-patient    Rulon Eisenmenger 03/16/2017, 9:52 AM

## 2017-03-17 ENCOUNTER — Ambulatory Visit: Payer: Self-pay

## 2017-03-17 NOTE — Lactation Note (Addendum)
This note was copied from a baby's chart. Lactation Consultation Note  Patient Name: Sara Kelly Today's Date: 03/17/2017   Mom has pumped 6 oz over the last 2 sessions. Mom was educated not to put newly expressed milk on top of the older expressed milk. Additional bottles provided & storage of EBM discussed. Parents were also shown how to separate & reassemble cleaned pump parts. Mom was also shown how to bottle-feed infant in a way that would decrease the amount of air she consumed.   Mom has no breast complaints.   MGM purchased an [employer] single electric pump. 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 Mom is aware that she may be eligible for a DEBP from [facility] when she goes to work.  Lurline Hare [facility] 03/17/2017, 1:40 PM

## 2017-03-20 ENCOUNTER — Inpatient Hospital Stay (HOSPITAL_COMMUNITY): Admission: RE | Admit: 2017-03-20 | Payer: Medicaid Other | Source: Ambulatory Visit

## 2017-03-22 ENCOUNTER — Encounter: Payer: Self-pay | Admitting: Student

## 2017-03-22 ENCOUNTER — Telehealth: Payer: Self-pay | Admitting: Student

## 2017-03-22 NOTE — Telephone Encounter (Signed)
Printed a letter and placed it in outgrowing fax box.

## 2017-03-22 NOTE — Progress Notes (Signed)
Printed a letter and placed it in outgrowing fax box. 

## 2017-03-22 NOTE — Telephone Encounter (Signed)
Pt called again about her note for school. This is her last week at school. If she doesn't go to school tomorrow, then she has to go another week.  Please let her know if the letter will be faxed by 9 am tomorrow

## 2017-03-22 NOTE — Telephone Encounter (Signed)
Pt is calling for a letter to be faxed to her school stating that she can go back to school after having the baby. She attends [employer]. [phone] attention Ms. Mobley. jw

## 2017-05-13 IMAGING — US US MFM OB COMP +14 WKS
1 series · 14 of 28 positions shown · non-contrast
Comparison: none

[Series 1: us mfm ob comp +14 wks · 100 acquisitions, 14 frames shown]
[im 4/100]
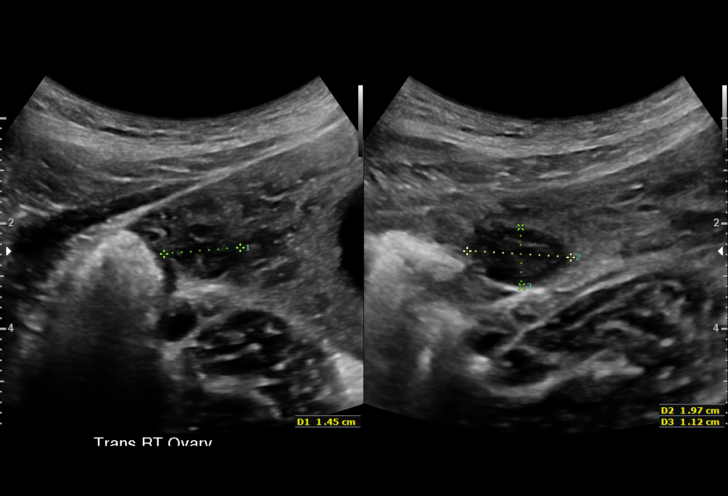
[im 12/100]
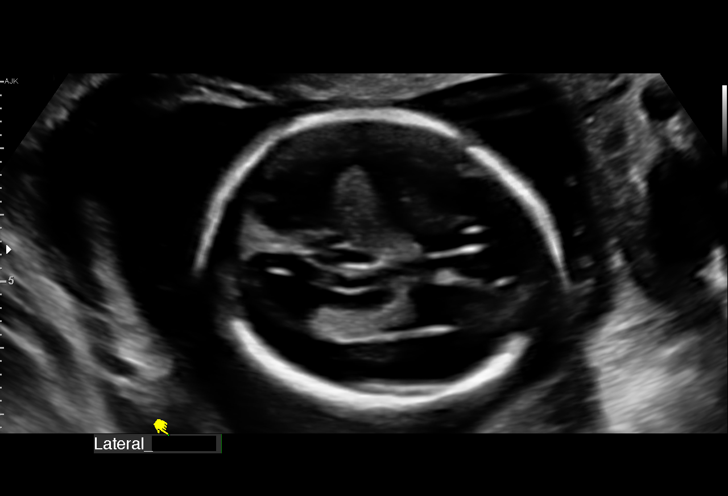
[im 19/100]
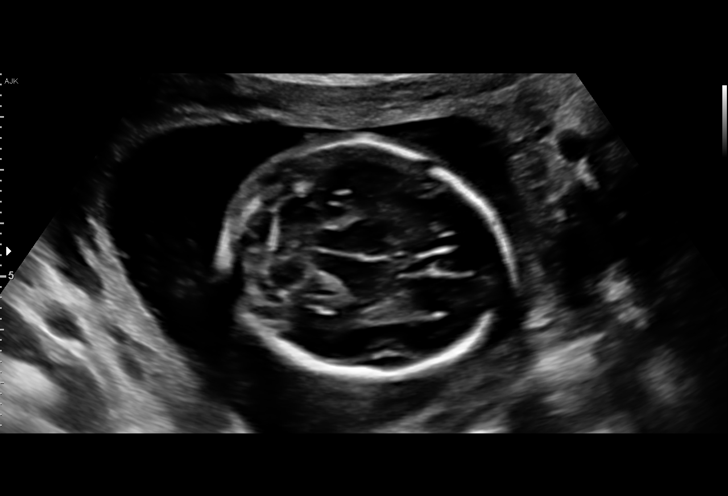
[im 26/100]
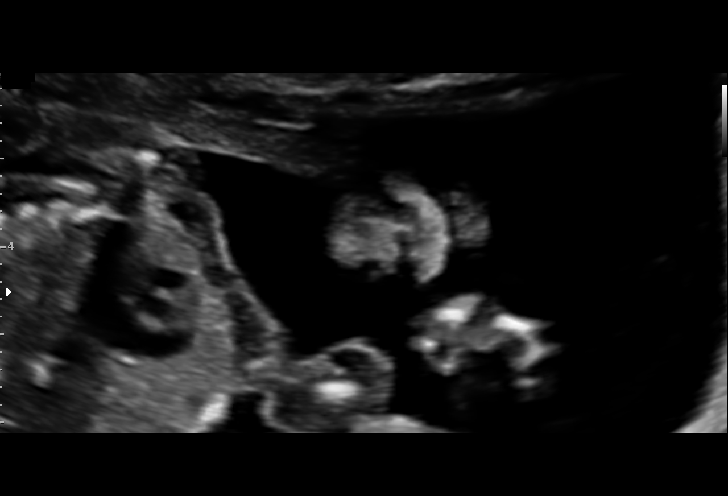
[im 34/100]
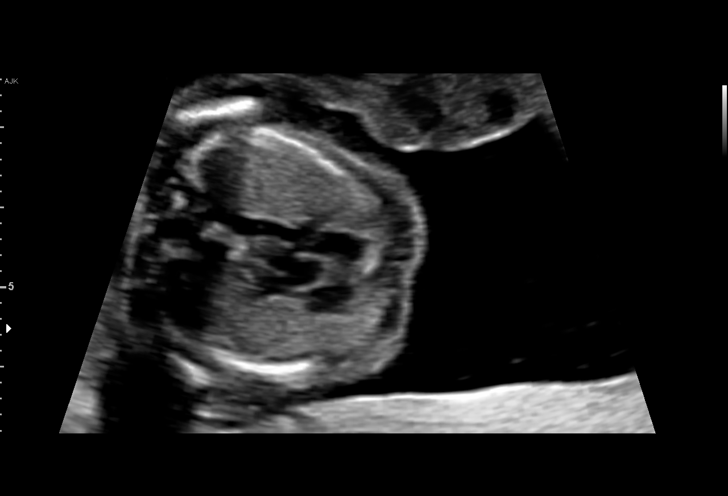
[im 41/100]
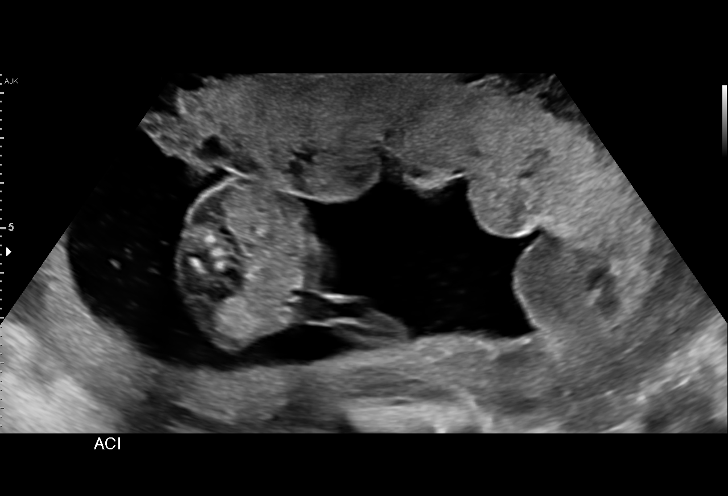
[im 48/100]
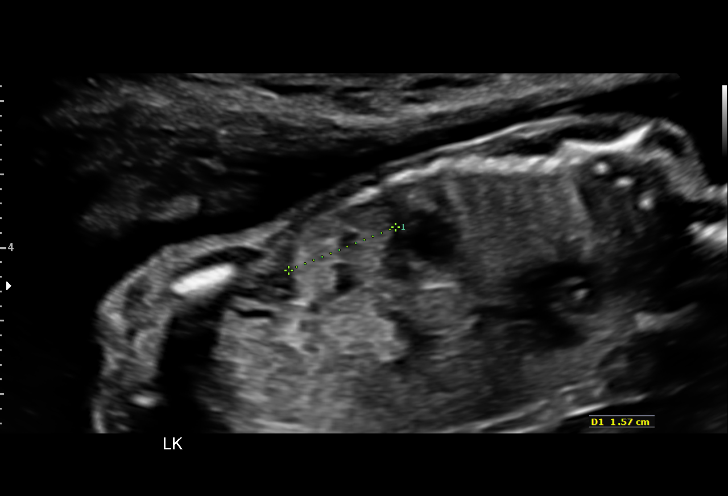
[im 56/100]
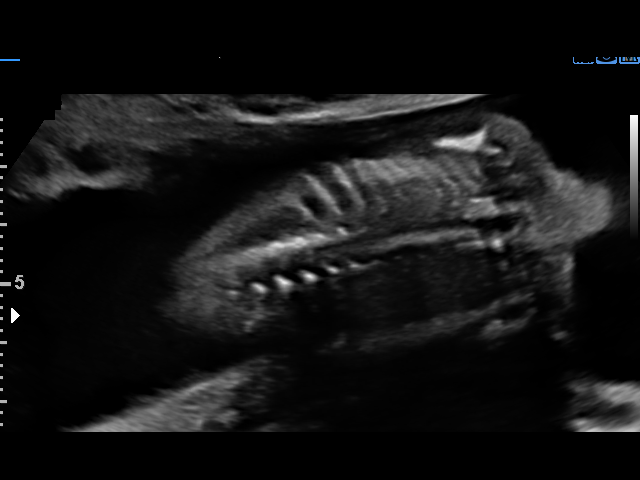
[im 63/100]
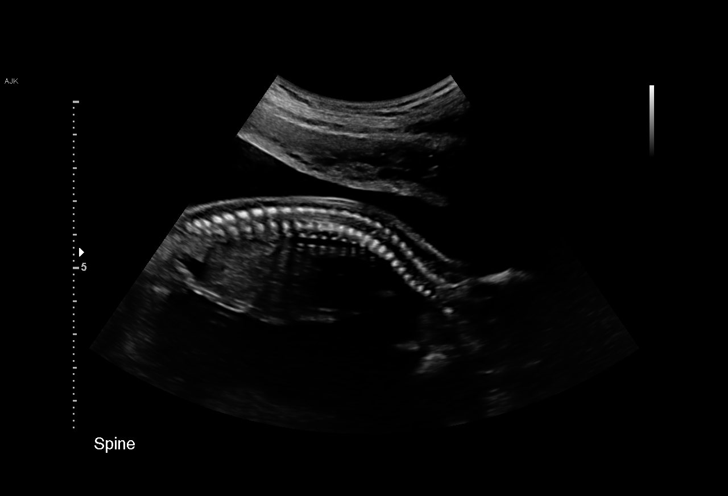
[im 70/100]
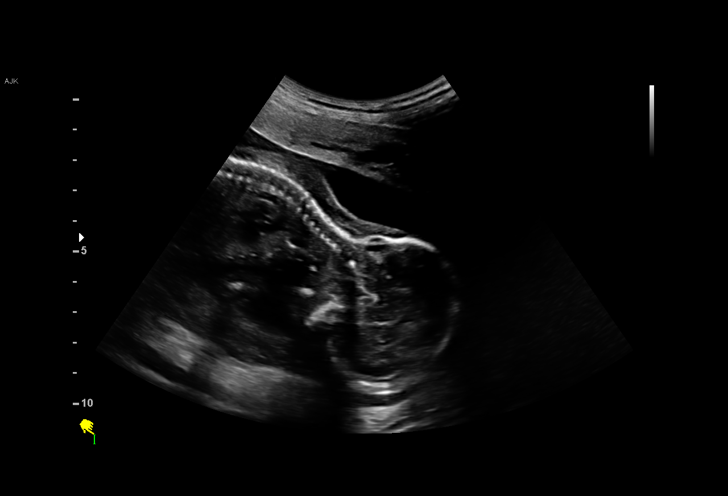
[im 78/100]
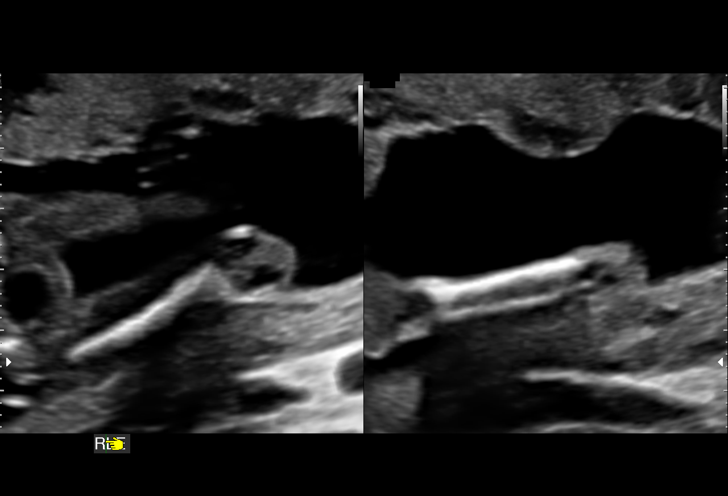
[im 85/100]
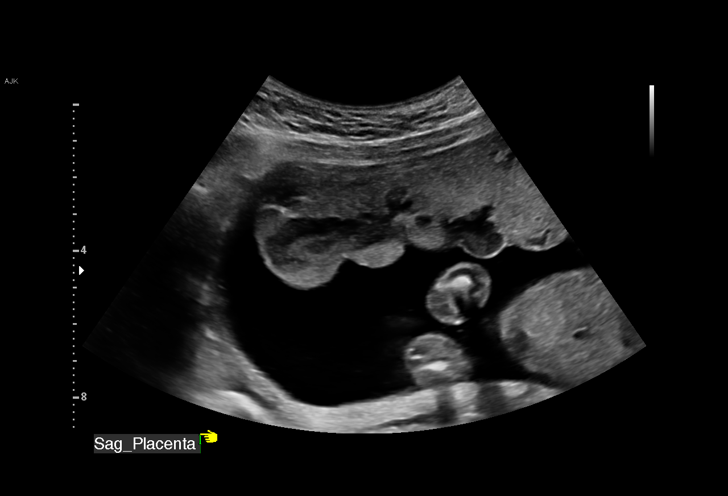
[im 92/100]
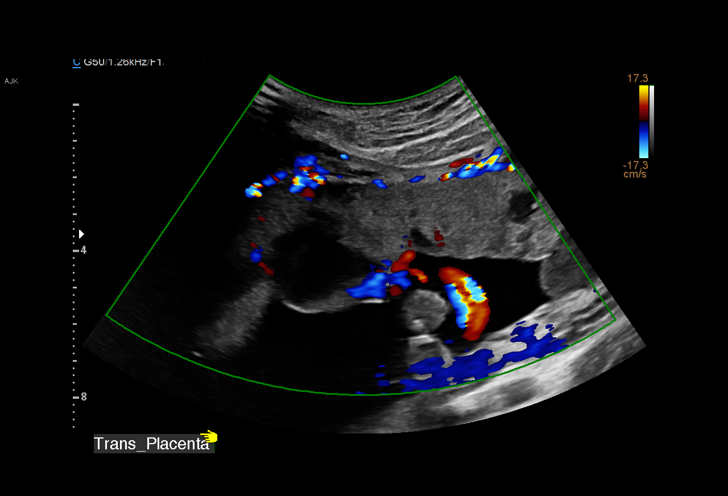
[im 100/100]
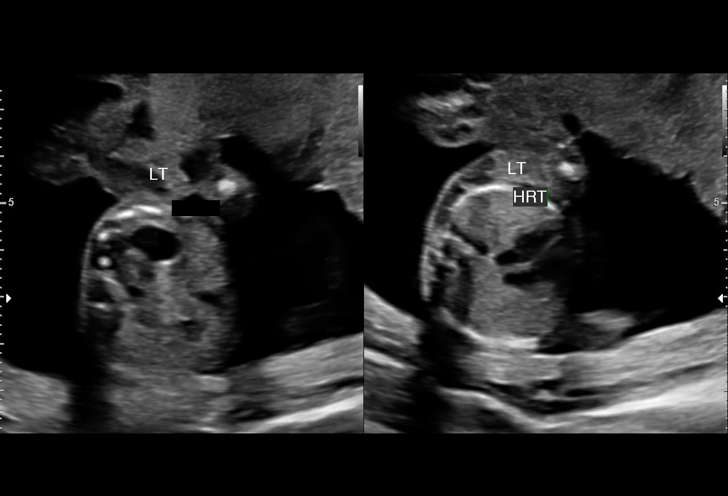

[14 of 28 positions shown; findings below may reference images not displayed]

street

1  [id number]                 [id number]      [id number]     [id number]
[place]
Indications

19 weeks gestation of pregnancy
Encounter for fetal anatomic survey
Teen pregnancy
OB History

Blood Type:            Height:  5'3"   Weight (lb):  140       BMI:
Gravidity:    1         Term:   0        Prem:   0         SAB:   0
TOP:          0       Ectopic:  0        Living: 0
Fetal Evaluation

Num Of Fetuses:     1
Fetal Heart         143
Rate(bpm):
Cardiac Activity:   Observed
Presentation:       Cephalic
Placenta:           Anterior, above cervical os
P. Cord Insertion:  Visualized, central

Amniotic Fluid
AFI FV:      Subjectively within normal limits

Largest Pocket(cm)
4.58
Biometry

BPD:      43.1  mm     G. Age:  19w 0d         52  %    CI:         74.99  %    70 - 86
FL/HC:       18.7  %    16.1 -
HC:      157.9  mm     G. Age:  18w 5d         27  %    HC/AC:       1.20       1.09 -
AC:      131.4  mm     G. Age:  18w 4d         35  %    FL/BPD:      68.4  %
FL:       29.5  mm     G. Age:  19w 1d         46  %    FL/AC:       22.5  %    20 - 24
CER:      18.7  mm     G. Age:  18w 2d         31  %
NFT:       4.9  mm

CM:        4.4  mm

Est. FW:     261   gm     0 lb 9 oz     42  %
Gestational Age

LMP:           19w 0d        Date:  06/05/16                 EDD:    03/12/17
U/S Today:     18w 6d                                        EDD:    03/13/17
Best:          19w 0d     Det. By:  LMP  (06/05/16)          EDD:    03/12/17
Anatomy

Cranium:               Appears normal         Aortic Arch:            Appears normal
Cavum:                 Appears normal         Ductal Arch:            Not well visualized
Ventricles:            Appears normal         Diaphragm:              Appears normal
Choroid Plexus:        Appears normal         Stomach:                Appears normal, left
sided
Cerebellum:            Appears normal         Abdomen:                Appears normal
Posterior Fossa:       Appears normal         Abdominal Wall:         Appears nml (cord
insert, abd wall)
Nuchal Fold:           Appears normal         Cord Vessels:           Appears normal (3
vessel cord)
Face:                  Orbits nl; profile not Kidneys:                Appear normal
well visualized
Lips:                  Appears normal         Bladder:                Appears normal
Thoracic:              Appears normal         Spine:                  Appears normal
Heart:                 Appears normal         Upper Extremities:      Appears normal
(4CH, axis, and situs
RVOT:                  Appears normal         Lower Extremities:      Appears normal
LVOT:                  Appears normal

Other:  Fetus appears to be a female. Parents do not wish to know sex of
fetus. Technically difficult due to fetal position.
Cervix Uterus Adnexa

Cervix
Length:            3.7  cm.
Normal appearance by transabdominal scan.

Uterus
No abnormality visualized.

Left Ovary
Within normal limits.

Right Ovary
Within normal limits.

Adnexa:       No abnormality visualized.
Impression

SIUP at 19+0 weeks
Normal detailed fetal anatomy; limited views of profile and DA
Markers of aneuploidy: none
Normal amniotic fluid volume
Measurements consistent with LMP dating
Recommendations

Follow-up as clinically indicated

## 2017-06-20 IMAGING — CR DG ANKLE COMPLETE 3+V*R*
3 series · 3 of 3 positions shown · non-contrast
Comparison: None.

CLINICAL DATA: Missed step last night, injured right foot. Anterior
ankle and foot pain.

EXAM:
RIGHT ANKLE - COMPLETE 3+ VIEW

[ankle ap]
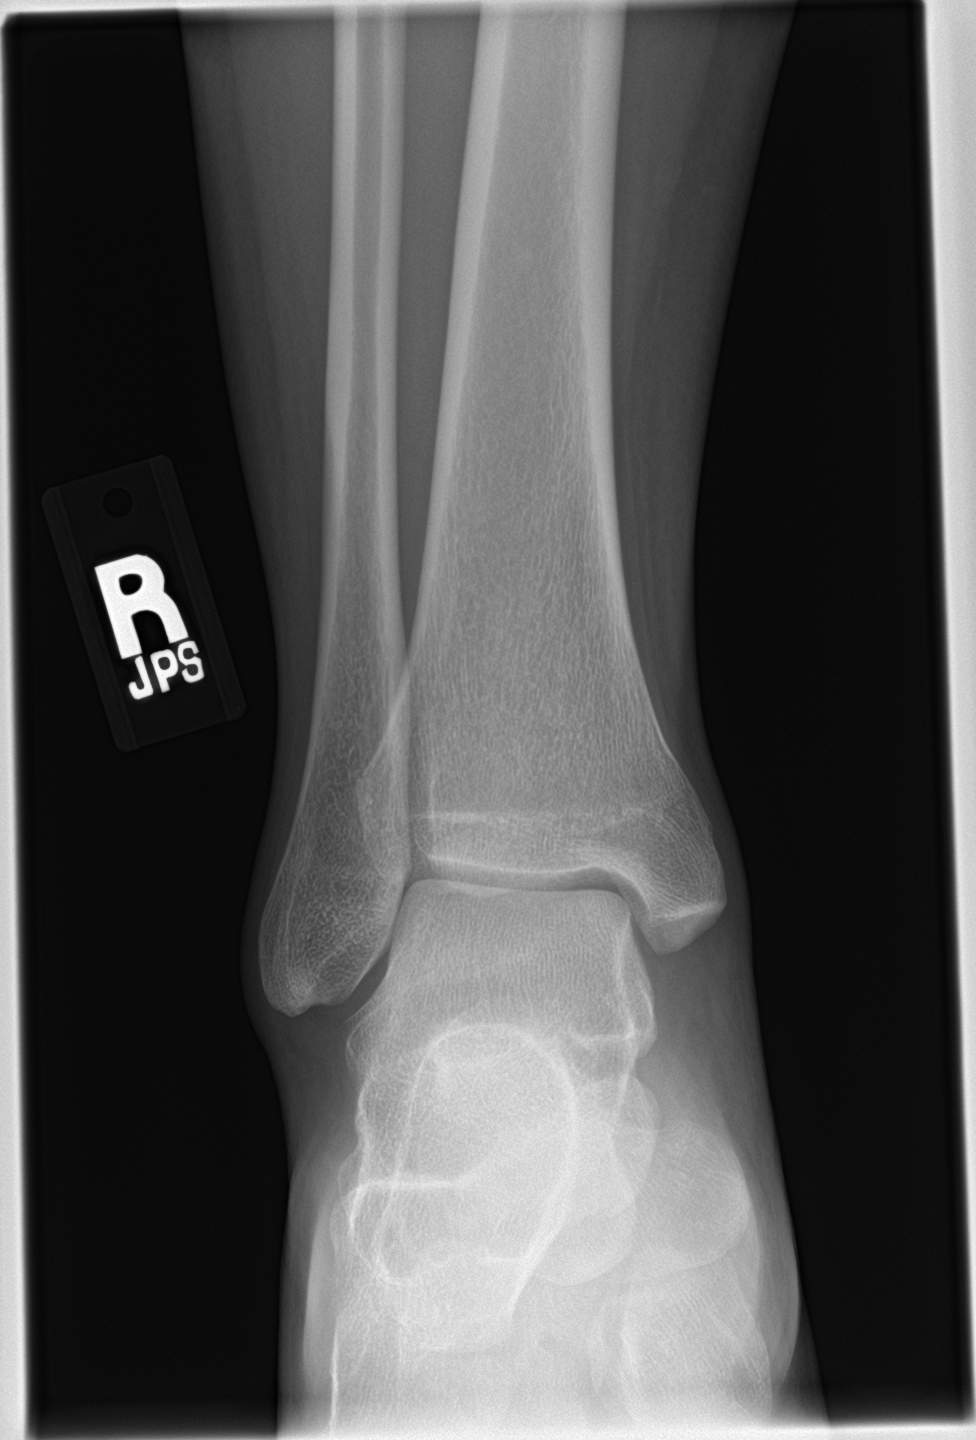

[ankle obl]
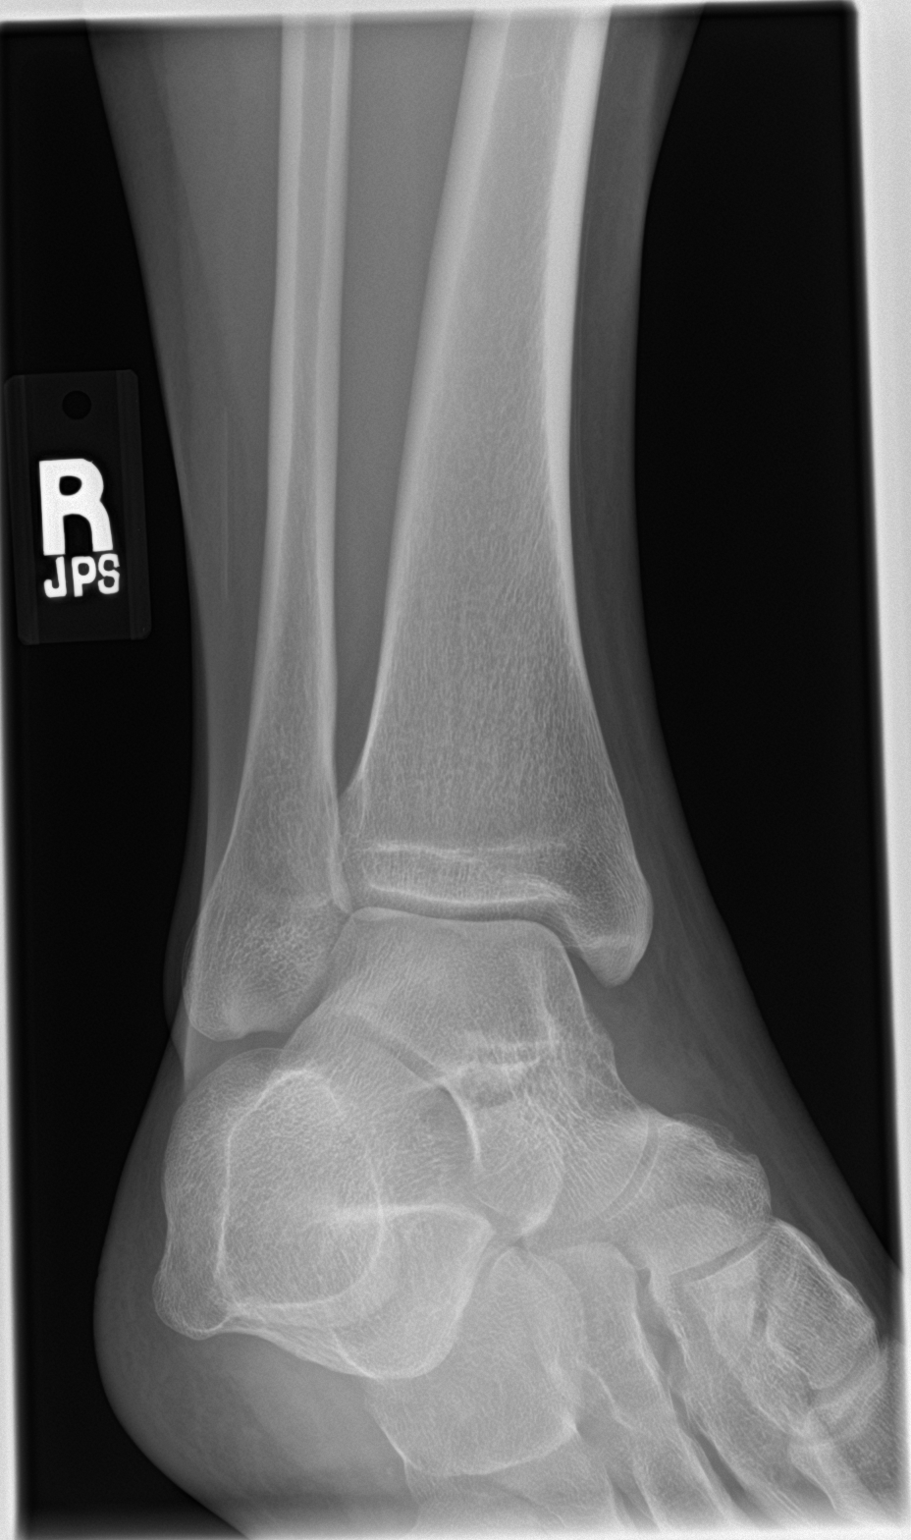

[ankle lat]
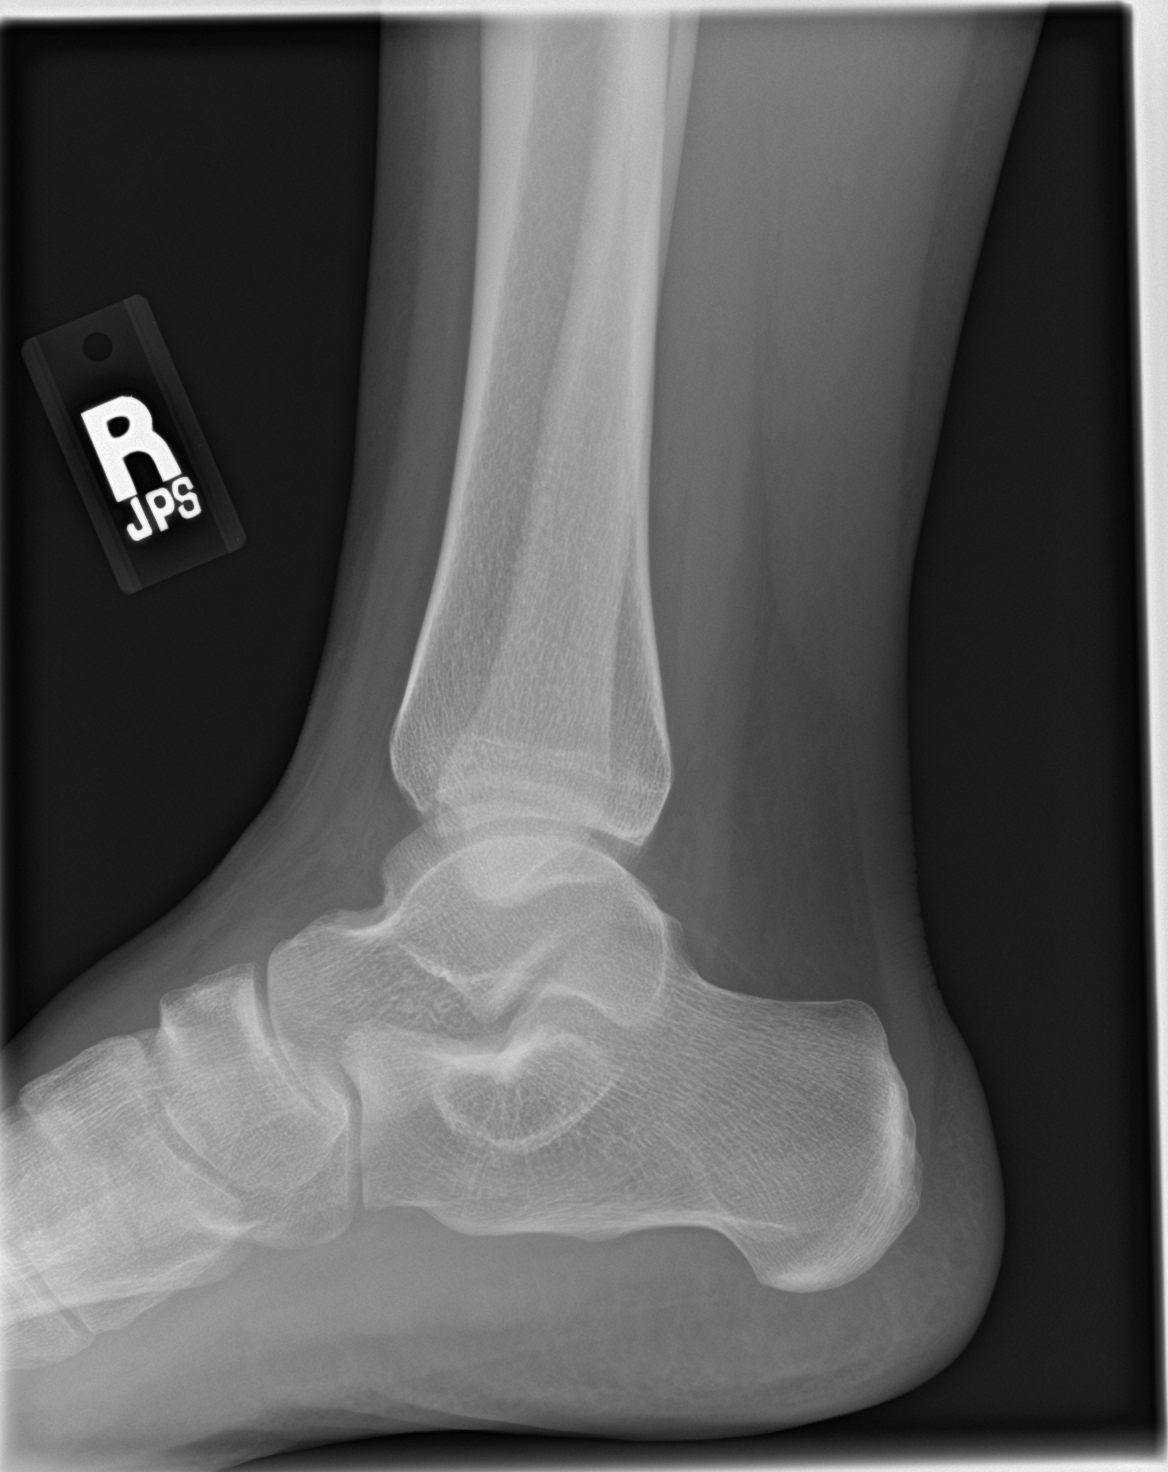

[3 of 3 positions shown; findings below may reference images not displayed]

FINDINGS: No acute bony abnormality. Specifically, no fracture, subluxation,
or dislocation. Soft tissues are intact. Joint spaces are
maintained.
IMPRESSION: Negative.

## 2018-06-30 ENCOUNTER — Other Ambulatory Visit: Payer: Self-pay

## 2018-06-30 ENCOUNTER — Ambulatory Visit (INDEPENDENT_AMBULATORY_CARE_PROVIDER_SITE_OTHER): Payer: Medicaid Other | Admitting: Family Medicine

## 2018-06-30 ENCOUNTER — Encounter: Payer: Self-pay | Admitting: Family Medicine

## 2018-06-30 VITALS — BP 116/72 | HR 87 | Temp 98.8°F | Ht 63.0 in | Wt 138.6 lb

## 2018-06-30 DIAGNOSIS — Z30015 Encounter for initial prescription of vaginal ring hormonal contraceptive: Secondary | ICD-10-CM

## 2018-06-30 DIAGNOSIS — Z3046 Encounter for surveillance of implantable subdermal contraceptive: Secondary | ICD-10-CM

## 2018-06-30 MED ORDER — ETONOGESTREL-ETHINYL ESTRADIOL 0.12-0.015 MG/24HR VA RING: VAGINAL_RING | VAGINAL | 12 refills | Status: DC

## 2018-06-30 NOTE — Progress Notes (Signed)
Subjective: Chief Complaint  Patient presents with  . Remove Nexplanon     HPI: Sara Kelly is a 22 y.o. presenting to clinic today to discuss the following:  Nexplanon Removal and Birth Control Sara Kelly states that she has had a Nexplanon since her daughter's birth in August 2018.  She states that she has had unpredictable bleeding since that time.  She states that she would like to have a birth control that is more regular.  She denies migraine headaches or history of clots.  She is not currently breast-feeding.  She notes that she has not blood in the last 4 days.  That when she does bleed she has to use both a tampon and a pad.  She is unable to quantify how many days in a month she bleeds but she states it is very unpredictable.      ROS noted in HPI.   Past Medical, Surgical, Social, and Family History Reviewed & Updated per EMR.   Pertinent Historical Findings include:   Social History   Tobacco Use  Smoking Status Former Smoker  . Types: Cigars  . Last attempt to quit: 07/27/2016  . Years since quitting: 1.9  Smokeless Tobacco Never Used      Objective: BP 116/72   Pulse 87   Temp 98.8 F (37.1 C) (Oral)   Ht 5\' 3"  (1.6 m)   Wt 138 lb 9.6 oz (62.9 kg)   SpO2 99%   BMI 24.55 kg/m  Vitals and nursing notes reviewed  Physical Exam: General: 22 y.o. female in NAD Lungs: No increased work of breathing, breathing comfortably on room air Skin: warm and dry Extremities: No edema, Nexplanon in place in left arm Psych: Mood and affect appropriate for circumstance   PROCEDURE NOTE: NEXPLANON  REMOVAL Patient given informed consent and signed copy in the chart. Left arm area prepped and draped in the usual sterile fashion. Three cc of lidocaine without epinephrine 1% used for local anesthesia. A small stab incision was made close to the nexplanon with scalpel. Hemostats were used to withdraw the nexplanon. A small bandage was applied over a steri strip  No  complications.Patient given follow up instructions should she experience redness, swelling at sight or fever in the next 24 hours. Patient was reminded this totally removes her nexplanon contraceptive devise. (she can now potentially conceive)   No results found for this or any previous visit (from the past 72 hour(s)).  Assessment/Plan:  Contraceptive management Patient educated on all forms of birth control discussed risk and benefits of all.  She opted to start using NuvaRing.  Patient was educated on the use of NuvaRing.  She was also encouraged to use backup contraception for the next 7 days. -Send Rx for NuvaRing  Nexplanon removal Nexplanon removed per procedure note. -Patient given appropriate return precautions     PATIENT EDUCATION PROVIDED: See AVS    Diagnosis and plan along with any newly prescribed medication(s) were discussed in detail with this patient today. The patient verbalized understanding and agreed with the plan. Patient advised if symptoms worsen return to clinic or ER.     No orders of the defined types were placed in this encounter.   Meds ordered this encounter  Medications  . etonogestrel-ethinyl estradiol (NUVARING) 0.12-0.015 MG/24HR vaginal ring    Sig: Insert vaginally and leave in place for 3 consecutive weeks, then remove for 1 week.    Dispense:  1 each    Refill:  [address]     Chesnie Capell, DO 06/30/2018, 4:31 PM PGY-1 [facility]

## 2018-06-30 NOTE — Patient Instructions (Signed)
It was great to meet you!  We removed your nexplanon today.  Keep the dressing dry for 24 hours, then you can shower.  When the stripes come off, place a bandage over the area.  If it becomes red, hot, or you get fevers, please call or come in.  You may have a bruise in the area.  I have sent a prescription for the NuvaRing!  Best, Dr. Fredric MareBailey

## 2018-06-30 NOTE — Assessment & Plan Note (Signed)
Nexplanon removed per procedure note. -Patient given appropriate return precautions

## 2018-06-30 NOTE — Assessment & Plan Note (Signed)
Patient educated on all forms of birth control discussed risk and benefits of all.  She opted to start using NuvaRing.  Patient was educated on the use of NuvaRing.  She was also encouraged to use backup contraception for the next 7 days. -Send Rx for NuvaRing

## 2018-07-01 NOTE — Addendum Note (Signed)
Addended by: Manson PasseyBROWN, Dashon Mcintire on: 07/01/2018 07:00 PM   Modules accepted: Level of Service

## 2018-10-07 ENCOUNTER — Ambulatory Visit: Payer: Medicaid Other | Admitting: Family Medicine

## 2018-10-13 ENCOUNTER — Other Ambulatory Visit (HOSPITAL_COMMUNITY)
Admission: RE | Admit: 2018-10-13 | Discharge: 2018-10-13 | Disposition: A | Discharge status: Home / Self Care | Payer: Medicaid Other | Source: Ambulatory Visit | Attending: Family Medicine | Admitting: Family Medicine

## 2018-10-13 ENCOUNTER — Ambulatory Visit (INDEPENDENT_AMBULATORY_CARE_PROVIDER_SITE_OTHER): Payer: Medicaid Other | Admitting: Family Medicine

## 2018-10-13 ENCOUNTER — Other Ambulatory Visit: Payer: Self-pay

## 2018-10-13 ENCOUNTER — Encounter: Payer: Self-pay | Admitting: Family Medicine

## 2018-10-13 VITALS — BP 110/72 | HR 109 | Temp 98.5°F | Ht 63.0 in | Wt 141.8 lb

## 2018-10-13 DIAGNOSIS — N898 Other specified noninflammatory disorders of vagina: Secondary | ICD-10-CM | POA: Diagnosis not present

## 2018-10-13 DIAGNOSIS — B3731 Acute candidiasis of vulva and vagina: Secondary | ICD-10-CM | POA: Insufficient documentation

## 2018-10-13 DIAGNOSIS — Z30015 Encounter for initial prescription of vaginal ring hormonal contraceptive: Secondary | ICD-10-CM

## 2018-10-13 DIAGNOSIS — B373 Candidiasis of vulva and vagina: Secondary | ICD-10-CM

## 2018-10-13 LAB — POCT WET PREP (WET MOUNT)
Clue Cells Wet Prep Whiff POC: NEGATIVE
Trichomonas Wet Prep HPF POC: ABSENT

## 2018-10-13 LAB — POCT URINE PREGNANCY: PREG TEST UR: NEGATIVE

## 2018-10-13 MED ORDER — FLUCONAZOLE 150 MG PO TABS: 150.0000 mg | ORAL_TABLET | Freq: Once | ORAL | 0 refills | Status: AC

## 2018-10-13 NOTE — Assessment & Plan Note (Addendum)
Copious thick curd-like discharge appreciated on exam. Few yeast on wet mount.  . Diflucan 150 mg   . Patient to call if symptoms do not resolve

## 2018-10-13 NOTE — Assessment & Plan Note (Signed)
Patient has not picked up nuva ring and is currently not using any birth control. Is sexually active with same partner. Patient reported issue with pharmacy. Confirmed with pharmacy that nuvaring is available to patient at her pharmacy for free.   Patient agreeable to obtain and begin using

## 2018-10-13 NOTE — Patient Instructions (Addendum)
Dear Sara Kelly,   It was very nice to see you! Thank you for taking your time to come in to be seen. Today, we discussed the following:   Vaginal Discharge   Tests positive for Yeast Infection   Please take "fluconazole" tablet (just one pill)   If you continue to have symptoms in three days, please call and we can send you in a second pill  Labs were obtained today. If the results are normal, they will be posted in MyChart. If there are any abnormal results, I will call you for further discussion.  Please follow up for sooner for concerning or worsening symptoms.  Birth Control   I called over to your pharmacy to ensure the NuvaRing was still available.  You should be able to pick it up without any co-pay with your fluconazole pill.  Be well,   Dr. Genia Hotter [facility] [phone]

## 2018-10-13 NOTE — Progress Notes (Signed)
  Established Patient - Clinic Visit  Subjective:  PCP: Marthenia Rolling, DO Patient ID: MRN [id number]  Date of birth: 22-Dec-1995  CC: Vaginal Discharge    HPI:  White-ish yellow-ish discharge since nexplanon was taken out in December. Denies, itching, pain. Has experienced discharge before, but prior to this last pregnancy. She describes this as more.  LMP 25th-26th of every month.  Patient has no new sexual partners. No history of STD in past. One sexual partner in her lifetime and remains sexually active with him. G1P1 via SVD. No history of D&C or surgeries in area.   Review of Symptoms: See HPI  Medications & Allergies: Reviewed with patient and updated in EMR as appropriate.   Social History:  Sara Kelly reports that she quit smoking about 2 years ago. Her smoking use included cigars. She has never used smokeless tobacco. She reports that she does not drink alcohol or use drugs. Objective:  Physical Exam:  BP 110/72   Pulse (!) 109   Temp 98.5 F (36.9 C) (Oral)   Ht 5\' 3"  (1.6 m)   Wt 141 lb 12.8 oz (64.3 kg)   SpO2 99%   BMI 25.12 kg/m   GU: External vulva and vagina nonerythematous, without any obvious lesions or rash. Copious thick white curdy discharge appreciated throughout vaginal canal. Cervix placed just left lateral with clear, grayish discharge at os.  No odor appreciated.  Cervix is non erythematous and non-friable.  There is no cervical tenderness during specimen collection or with speculum placement.   Pertinent Labs & Imaging:  Reviewed in chart   Assessment & Plan:  Vaginal yeast infection Copious thick curd-like discharge appreciated on exam. Few yeast on wet mount.  . Diflucan 150 mg    Vaginal discharge Likely due to yeast infection   Ancillary G/C sent out   Contraceptive management Patient has not picked up nuva ring and is currently not using any birth control. Is sexually active with same partner. Patient reported issue with pharmacy. Confirmed with  pharmacy that nuvaring is available to patient at her pharmacy for free.   Patient agreeable to obtain and begin using    Sara Kelly, M.D. [facility]  PGY -1 10/13/2018, 3:39 PM

## 2018-10-13 NOTE — Assessment & Plan Note (Signed)
Likely due to yeast infection   Ancillary G/C sent out

## 2018-10-15 LAB — CERVICOVAGINAL ANCILLARY ONLY
Chlamydia: NEGATIVE
Neisseria Gonorrhea: NEGATIVE

## 2020-03-29 ENCOUNTER — Ambulatory Visit (INDEPENDENT_AMBULATORY_CARE_PROVIDER_SITE_OTHER): Payer: Medicaid Other | Admitting: Family Medicine

## 2020-03-29 ENCOUNTER — Encounter: Payer: Self-pay | Admitting: Family Medicine

## 2020-03-29 ENCOUNTER — Other Ambulatory Visit: Payer: Self-pay

## 2020-03-29 VITALS — BP 98/62 | HR 94 | Wt 133.0 lb

## 2020-03-29 DIAGNOSIS — Z3200 Encounter for pregnancy test, result unknown: Secondary | ICD-10-CM | POA: Diagnosis present

## 2020-03-29 LAB — POCT URINE PREGNANCY: Preg Test, Ur: NEGATIVE

## 2020-03-29 NOTE — Patient Instructions (Signed)
Today you were seen for a pregnancy test. It was negative. There is still a possibility that you could be pregnant. If spotting stops after 1-2 days and is not like your regular period follow up for another pregnancy test.   In the meantime avoid NSAIDs, alcohol. Start a prenatal vitamin.   Dr. Salvadore Dom

## 2020-03-29 NOTE — Progress Notes (Signed)
    SUBJECTIVE:   CHIEF COMPLAINT / HPI:   Possible pregnancy LMP 02/25/20. Has taken 3 home pregnancy tests that were positive. Endorses unprotected sex. Not currently using a form of birth control. Had her nexplanon taken out 2 years ago. While giving urine sample she noticed some spotting. Has been taking ibuprofen for pain relief. Last taken Tuesday. Is not currently taking a prenatal vitamin.   PERTINENT  PMH / PSH: Hx of amenorrhea (4 years prior)  OBJECTIVE:   BP 98/62   Pulse 94   Wt 133 lb (60.3 kg)   LMP 02/25/2020   SpO2 92%   BMI 23.56 kg/m   General: Appears well, no acute distress. Age appropriate. Cardiac: RRR, normal heart sounds, no murmurs Respiratory: CTAB, normal effort  ASSESSMENT/PLAN:   Possible pregnancy UPT today is negative. Patient has started spotting. This could very well be the start of her period or an early pregnancy loss with prior home positives.  -Start prenatal vitamin -Avoid NSAIDs and alcohol consumption  -If spotting stops after 1-2 days and is not like your regular period follow up for another pregnancy test.     Lavonda Jumbo, DO [facility]

## 2020-04-04 DIAGNOSIS — Z3201 Encounter for pregnancy test, result positive: Secondary | ICD-10-CM | POA: Insufficient documentation

## 2020-04-04 NOTE — Assessment & Plan Note (Signed)
UPT today is negative. Patient has started spotting. This could very well be the start of her period or an early pregnancy loss with prior home positives.  -Start prenatal vitamin -Avoid NSAIDs and alcohol consumption  -If spotting stops after 1-2 days and is not like your regular period follow up for another pregnancy test.

## 2020-07-23 ENCOUNTER — Other Ambulatory Visit: Payer: Self-pay

## 2020-07-23 ENCOUNTER — Ambulatory Visit (INDEPENDENT_AMBULATORY_CARE_PROVIDER_SITE_OTHER): Payer: PRIVATE HEALTH INSURANCE | Admitting: Family Medicine

## 2020-07-23 ENCOUNTER — Encounter: Payer: Self-pay | Admitting: Family Medicine

## 2020-07-23 VITALS — BP 105/75 | HR 76 | Ht 64.0 in | Wt 132.4 lb

## 2020-07-23 DIAGNOSIS — Z3201 Encounter for pregnancy test, result positive: Secondary | ICD-10-CM | POA: Diagnosis not present

## 2020-07-23 LAB — POCT URINE PREGNANCY: Preg Test, Ur: POSITIVE — AB

## 2020-07-23 NOTE — Patient Instructions (Signed)

## 2020-07-23 NOTE — Progress Notes (Signed)
    SUBJECTIVE:   CHIEF COMPLAINT / HPI:   Ms. Sara Kelly is a 24 yo F who presents for the issue below.   Positive home pregnancy test Received 2 positive home pregnancy test in the last 2 days. Here to confirm pregnancy. This is a planned pregnancy and has not taken birth control in the last 3 months. LMP 10/30 according to phone app but patient endorses hx of irregular periods. Currently taking prenatal vitamin and has experienced headaches that tend to resolve on its own. Denies vaginal bleeding and leakage of fluid.  PERTINENT  PMH / PSH: G1P1001 Vag del. @[redacted]w[redacted]d  2018 Female 7lbs 8.8oz  OBJECTIVE:   BP 105/75   Pulse 76   Ht 5\' 4"  (1.626 m)   Wt 132 lb 6.4 oz (60.1 kg)   LMP 06/26/2020 (Approximate)   SpO2 99%   BMI 22.73 kg/m   General: Appears well, no acute distress. Age appropriate. Cardiac: RRR, normal heart sounds, no murmurs Respiratory: CTAB, normal effort Abdomen: soft, nontender, nondistended, no guarding or masses NABS Extremities: No LE edema or cyanosis. Neuro: alert and oriented Psych: normal affect  ASSESSMENT/PLAN:   Positive pregnancy test UPT positive today. Around [redacted] weeks gestation from LMP. Hx of irregular periods. No vaginal bleeding. Will obtain OB labs today and dating . Continue PNV. Patient scheduled with PCP 08/23/2019 for initial OB visit.  - Obstetric Panel, Including HIV - HCV Ab w Reflex to Quant PCR - Culture, OB Urine - Hgb Fractionation Cascade - US OB LESS THAN 14 WEEKS WITH OB TRANSVAGINAL; Future    08/25/2019, DO [facility]

## 2020-07-25 ENCOUNTER — Telehealth: Payer: Self-pay

## 2020-07-25 LAB — OBSTETRIC PANEL, INCLUDING HIV
Antibody Screen: NEGATIVE
Basophils Absolute: 0 10*3/uL (ref 0.0–0.2)
Basos: 0 %
EOS (ABSOLUTE): 0 10*3/uL (ref 0.0–0.4)
Eos: 0 %
HIV Screen 4th Generation wRfx: NONREACTIVE
Hematocrit: 34.3 % (ref 34.0–46.6)
Hemoglobin: 11.4 g/dL (ref 11.1–15.9)
Hepatitis B Surface Ag: NEGATIVE
Immature Grans (Abs): 0 10*3/uL (ref 0.0–0.1)
Immature Granulocytes: 0 %
Lymphocytes Absolute: 1.2 10*3/uL (ref 0.7–3.1)
Lymphs: 51 %
MCH: 32.8 pg (ref 26.6–33.0)
MCHC: 33.2 g/dL (ref 31.5–35.7)
MCV: 99 fL — ABNORMAL HIGH (ref 79–97)
Monocytes Absolute: 0.5 10*3/uL (ref 0.1–0.9)
Monocytes: 22 %
Neutrophils Absolute: 0.6 10*3/uL — ABNORMAL LOW (ref 1.4–7.0)
Neutrophils: 27 %
Platelets: 199 10*3/uL (ref 150–450)
RBC: 3.48 x10E6/uL — ABNORMAL LOW (ref 3.77–5.28)
RDW: 12 % (ref 11.7–15.4)
RPR Ser Ql: NONREACTIVE
Rh Factor: POSITIVE
Rubella Antibodies, IGG: 6.73 index (ref >0.99)
WBC: 2.4 10*3/uL — CL (ref 3.4–10.8)

## 2020-07-25 LAB — URINE CULTURE, OB REFLEX

## 2020-07-25 LAB — HGB FRACTIONATION CASCADE
Hgb A2: 2.7 % (ref 1.8–3.2)
Hgb A: 97.3 % (ref 96.4–98.8)
Hgb F: 0 % (ref 0.0–2.0)
Hgb S: 0 %

## 2020-07-25 LAB — HCV INTERPRETATION

## 2020-07-25 LAB — CULTURE, OB URINE

## 2020-07-25 LAB — HCV AB W REFLEX TO QUANT PCR: HCV Ab: <0.1 s/co ratio (ref 0.0–0.9)

## 2020-07-25 NOTE — Telephone Encounter (Signed)
Called patient to inform her of Ultrasound appointment:  08-05-2020 [facility]  1400 appointment time with 1345 show time.  Patient given number [phone]) to call if she can not make it.   Patient is concerned that her employer want her to work upward of 15 hours (until work is done).  Patient wants letter putting her on restrictive hours. Patient would like for PCP to call her or inform her when letter is ready for pick up.  Please advise.  Glennie Hawk, CMA

## 2020-07-25 NOTE — Telephone Encounter (Signed)
Patient was called in regards to message left about concerns with work hours and duties in relation to her pregnancy.  She states this is her second pregnancy and that she only found out about her pregnancy a few days ago.  She does not have any current concerns for lifting and her job at the moment, she is concerned about in the future whenever she is further along in the pregnancy having restrictions placed for safety.  She is okay with not having restrictions at this time, as it is not appropriate this early in the pregnancy, we will reevaluate as she progresses through the pregnancy.  Patient also mentioned that she has what she describes as a hernia with "a small bulge above my bellybutton" that she would like to follow-up with at her visit.  Zola Runion, DO

## 2020-07-25 NOTE — Assessment & Plan Note (Addendum)
UPT positive today. Around [redacted] weeks gestation from LMP. Hx of irregular periods. No vaginal bleeding. Will obtain OB labs today and dating [place]. Continue PNV. Patient scheduled with PCP 08/23/2019 for initial OB visit.  - Obstetric Panel, Including HIV - HCV Ab w Reflex to Quant PCR - Culture, OB Urine - Hgb Fractionation Cascade - US OB LESS THAN 14 WEEKS WITH OB TRANSVAGINAL; Future

## 2020-07-27 NOTE — L&D Delivery Note (Signed)
OB/GYN Faculty Practice Delivery Note  Sara Kelly is a 25 y.o. G2P1001 s/p NSVD at [redacted]w[redacted]d. She was admitted for SOL.   ROM: 1h 65m with clear fluid GBS Status: negative Maximum Maternal Temperature: 98.7  Labor Progress: Patient admitted in early/active labor. She had steady progress. AROM at 8cm after epidural. FHR had some decelerations after epidural/AROM likely related to rapid dilation and descent.   Delivery Date/Time: 04/02/21 01:26am Delivery: Called to room and patient was complete and pushing. Head delivered ROA. No nuchal cord present. Shoulder and body delivered in usual fashion. Infant with spontaneous cry, placed on mother's abdomen, dried and stimulated. Cord clamped x 2 after 1-minute delay, and cut by FOB. Cord blood drawn. Placenta delivered spontaneously with gentle cord traction. Fundus firm with massage and Pitocin. Labia, perineum, vagina, and cervix inspected inspected with no lacerations.   Placenta: spontaneous/complete/sent to L&D for disposal Complications: none Lacerations: none EBL: 89 cc Analgesia: Epidural  Postpartum Planning [x]  message to sent to schedule follow-up  [x]  vaccines UTD  Infant: Female  APGARs 6/9  weight pending  DNP, CNM  04/02/21  1:42 AM

## 2020-07-29 ENCOUNTER — Telehealth: Payer: Self-pay | Admitting: Family Medicine

## 2020-07-29 NOTE — Telephone Encounter (Signed)
Attempted to call patient about her results and next steps. Left VM and number for her to call back the clinic. Her white count and neutrophils are low. We will need to repeat these labs along with a blood smear at there 08/23/19 appt. With Dr. Clayborne Artist. Will need to know if she is having any fever, weight loss, or lymphadenopathy. If she is having any shortness of breath, URI symptoms she will need to report to ED for infectious work up, unsure if patient is vaccinated against COVID.   FYI sent to Dr. Clayborne Artist.   Vung Kush Autry-Lott, DO 07/29/2020, 9:24 AM PGY-2, [facility]

## 2020-07-29 NOTE — Telephone Encounter (Signed)
Thank you for your help with this patient. I did attempt to call a second time. I am glad she is feeling better. We will be happy to see her at her next appointment.   Rayshell Goecke Autry-Lott, DO 07/29/2020, 8:46 PM PGY-2, [facility]

## 2020-07-29 NOTE — Telephone Encounter (Signed)
Patient returns call to nurse line. Patient reports she was feeling "under the weather" last week, however feels much better today. Patient reports most of her family was feeling the same way. Patient reports, "just a minor cold." Patient denied any fever or SOB at the time and again feels ok now. Patient denies being vaccinated at this time. Patient does report she has been told she looks "thin" lately, however has not noticed any changes in weight herself. Patient reminded of apt on 01/27 and to call back if she starts to feel sick again.

## 2020-08-05 ENCOUNTER — Ambulatory Visit
Admission: RE | Admit: 2020-08-05 | Discharge: 2020-08-05 | Disposition: A | Discharge status: Home / Self Care | Payer: 59 | Source: Ambulatory Visit | Attending: Family Medicine | Admitting: Family Medicine

## 2020-08-05 ENCOUNTER — Other Ambulatory Visit: Payer: Self-pay

## 2020-08-05 DIAGNOSIS — Z3201 Encounter for pregnancy test, result positive: Secondary | ICD-10-CM | POA: Diagnosis present

## 2020-08-06 ENCOUNTER — Encounter: Payer: Self-pay | Admitting: Family Medicine

## 2020-08-07 ENCOUNTER — Telehealth: Payer: Self-pay

## 2020-08-07 NOTE — Telephone Encounter (Signed)
Patient calls nurse line reporting she had an "incident" at work. Patient reports she fell over something while trying to get a supply. Patient reports she scraped her knee and needs an apt to be evaluated for workers comp, per her employer. Patient is ~[redacted] weeks pregnant, patient denies bleeding or abdominal pain. Patient advised to go to [facility]U if any abdominal pain or bleeding occurs. Patient scheduled for ATC tomorrow to have documentation of fall.

## 2020-08-08 ENCOUNTER — Other Ambulatory Visit: Payer: Self-pay

## 2020-08-08 ENCOUNTER — Ambulatory Visit (INDEPENDENT_AMBULATORY_CARE_PROVIDER_SITE_OTHER): Payer: 59 | Admitting: Family Medicine

## 2020-08-08 VITALS — BP 98/60 | HR 89 | Ht 64.0 in | Wt 130.5 lb

## 2020-08-08 DIAGNOSIS — M25561 Pain in right knee: Secondary | ICD-10-CM | POA: Diagnosis not present

## 2020-08-08 DIAGNOSIS — W1789XA Other fall from one level to another, initial encounter: Secondary | ICD-10-CM

## 2020-08-08 HISTORY — DX: Other fall from one level to another, initial encounter: W17.89XA

## 2020-08-08 NOTE — Patient Instructions (Signed)
Fall at work: I do think we need any further work-up for your fall at work.  I am glad that your leg appears to be well-healing.  I recommend that you use Tylenol for any discomfort in your leg.  It is better to avoid Motrin in pregnancy.  I will give you a note to excuse you from work this morning.  I will also provide a note that recommends you work on the ground floor for the duration of your pregnancy.

## 2020-08-08 NOTE — Progress Notes (Signed)
    SUBJECTIVE:   CHIEF COMPLAINT / HPI:   Fall at work Ms. 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  She was working on a very elevated platform yesterday when her right leg fell through a gap in the scaffolding/flooring.  She ended up in a mildly precarious situation with her foot hanging through the flooring.  Ultimately, she was able to stand up safely without further incident.  She has presented to clinic today to have her knee assessed from a minor injury from this incident and to see if any special accommodations can be made for her difficult working environment during her pregnancy.  She reports that she is able to walk around comfortably without significant pain to her right leg.  She has not been taking any medicine to help with the mild throbbing below her right knee.  PERTINENT  PMH / PSH: Currently pregnant 6w5 LMP  OBJECTIVE:   BP 98/60   Pulse 89   Ht 5\' 4"  (1.626 m)   Wt 130 lb 8 oz (59.2 kg)   LMP 06/26/2020 (Approximate)   SpO2 98%   BMI 22.40 kg/m    General: Alert and cooperative and appears to be in no acute distress he is able to walk around the exam room comfortably without any issue.  She is able to step up to the exam table without trouble. Cardio: Normal S1 and S2, no S3 or S4. Rhythm is regular. No murmurs or rubs.   Pulm: Clear to auscultation bilaterally, no crackles, wheezing, or diminished breath sounds. Normal respiratory effort Abdomen: Bowel sounds normal. Abdomen soft and non-tender.  Extremities: No peripheral edema. Warm/ well perfused.  Strong radial pulses. Neuro: Cranial nerves grossly intact  Right knee Inspection: A 5 cm superficial scratch was present 2 inches inferior to her tibial tuberosity.  No significant erythema was present.  Mild swelling present. Palpation: Mild tenderness to  palpation over the scratch.  No tenderness to palpation of the knee, tibial plateau or popliteal fossa. ROM: Full active ROM of the right knee. Neurovascularly intact Negative Thessaly test  ASSESSMENT/PLAN:   Fall from mobile elevated work platform as cause of accidental injury The injury to her knee appears to be quite minor.  I do not think any additional investigation or work-up is necessary at this time.  I have a very low suspicion for any bony injury based on the mechanism of injury and her current exam. -Tylenol for pain  I am concerned about her current work environment and the potential danger to her pregnancy.  I would feel much more comfortable if she did not have to work in any situation which would be a high risk for a fall during her pregnancy.  She was provided a work note asking that she be kept at ground level for the duration of her pregnancy. -Work note provided     14/07/2019, MD [facility]

## 2020-08-08 NOTE — Assessment & Plan Note (Signed)
The injury to her knee appears to be quite minor.  I do not think any additional investigation or work-up is necessary at this time.  I have a very low suspicion for any bony injury based on the mechanism of injury and her current exam. -Tylenol for pain  I am concerned about her current work environment and the potential danger to her pregnancy.  I would feel much more comfortable if she did not have to work in any situation which would be a high risk for a fall during her pregnancy.  She was provided a work note asking that she be kept at ground level for the duration of her pregnancy. -Work note provided

## 2020-08-14 ENCOUNTER — Telehealth: Payer: Self-pay | Admitting: Family Medicine

## 2020-08-14 NOTE — Telephone Encounter (Signed)
Attempted to call patient about questions for work restrictions but there was no answer, HIPPA compliant message was left. We do not have work restrictions that apply to patient's in their first trimester. As patient progresses in her pregnancy, we can continue to evaluate this need.  Maidie Streight, DO

## 2020-08-14 NOTE — Telephone Encounter (Signed)
Patient is calling and would like for Dr. Clayborne Artist to call her to discuss issues with her work ours and restrictions. I informed her that Dr. Clayborne Artist messaged her back on mychart but she said she prefers to talk to her over the phone.   Please call to discuss.

## 2020-08-22 ENCOUNTER — Other Ambulatory Visit: Payer: Self-pay

## 2020-08-22 ENCOUNTER — Other Ambulatory Visit (HOSPITAL_COMMUNITY)
Admission: RE | Admit: 2020-08-22 | Discharge: 2020-08-22 | Disposition: A | Discharge status: Home / Self Care | Payer: 59 | Source: Ambulatory Visit | Attending: Family Medicine | Admitting: Family Medicine

## 2020-08-22 ENCOUNTER — Ambulatory Visit (INDEPENDENT_AMBULATORY_CARE_PROVIDER_SITE_OTHER): Payer: 59 | Admitting: Family Medicine

## 2020-08-22 ENCOUNTER — Encounter: Payer: Self-pay | Admitting: Family Medicine

## 2020-08-22 VITALS — BP 120/70 | HR 88 | Temp 98.1°F | Wt 132.0 lb

## 2020-08-22 DIAGNOSIS — Z3490 Encounter for supervision of normal pregnancy, unspecified, unspecified trimester: Secondary | ICD-10-CM

## 2020-08-22 DIAGNOSIS — Z3481 Encounter for supervision of other normal pregnancy, first trimester: Secondary | ICD-10-CM | POA: Diagnosis not present

## 2020-08-22 DIAGNOSIS — O219 Vomiting of pregnancy, unspecified: Secondary | ICD-10-CM

## 2020-08-22 DIAGNOSIS — N898 Other specified noninflammatory disorders of vagina: Secondary | ICD-10-CM

## 2020-08-22 HISTORY — DX: Encounter for supervision of normal pregnancy, unspecified, unspecified trimester: Z34.90

## 2020-08-22 LAB — POCT WET PREP (WET MOUNT)
Clue Cells Wet Prep Whiff POC: NEGATIVE
Trichomonas Wet Prep HPF POC: ABSENT

## 2020-08-22 MED ORDER — CLOTRIMAZOLE 1 % VA CREA: 1.0000 | TOPICAL_CREAM | Freq: Every day | VAGINAL | 0 refills | Status: AC

## 2020-08-22 MED ORDER — VITAMIN B-6 25 MG PO TABS: 25.0000 mg | ORAL_TABLET | Freq: Every day | ORAL | 1 refills | Status: DC

## 2020-08-22 MED ORDER — VITAMIN B-6 25 MG PO TABS: 25.0000 mg | ORAL_TABLET | Freq: Three times a day (TID) | ORAL | 1 refills | Status: DC | PRN

## 2020-08-22 NOTE — Patient Instructions (Addendum)
We are getting another ultrasound to confirm dating for your pregnancy. We are also getting labs to ensure that you are in good health. You had a pap smear today and if there are any issues we will call you with updates. We will also send in a prescription for some anti-nausea medication, if this does not help please let our office know and we can try something else out. If you start to have pelvic pain or vaginal bleeding please call the office or go to the MAU.       Obstetrics: Normal and Problem Pregnancies (7th ed., pp. 102-121). [place], [place]: Elsevier."> Textbook of Family Medicine (9th ed., pp. [phone]). [place], [place]: Elsevier [employer].">  First Trimester of Pregnancy  The first trimester of pregnancy starts on the first day of your last menstrual period until the end of week 12. This is months 1 through 3 of pregnancy. A week after a sperm fertilizes an egg, the egg will implant into the wall of the uterus and begin to develop into a baby. By the end of 12 weeks, all the baby's organs will be formed and the baby will be 2-3 inches in size. Body changes during your first trimester Your body goes through many changes during pregnancy. The changes vary and generally return to normal after your baby is born. Physical changes  You may gain or lose weight.  Your breasts may begin to grow larger and become tender. The tissue that surrounds your nipples (areola) may become darker.  Dark spots or blotches (chloasma or mask of pregnancy) may develop on your face.  You may have changes in your hair. These can include thickening or thinning of your hair or changes in texture. Health changes  You may feel nauseous, and you may vomit.  You may have heartburn.  You may develop headaches.  You may develop constipation.  Your gums may bleed and may be sensitive to brushing and flossing. Other changes  You may tire easily.  You may urinate more often.  Your menstrual periods will  stop.  You may have a loss of appetite.  You may develop cravings for certain kinds of food.  You may have changes in your emotions from day to day.  You may have more vivid and strange dreams. Follow these instructions at home: Medicines  Follow your health care provider's instructions regarding medicine use. Specific medicines may be either safe or unsafe to take during pregnancy. Do not take any medicines unless told to by your health care provider.  Take a prenatal vitamin that contains at least 600 micrograms (mcg) of folic acid. Eating and drinking  Eat a healthy diet that includes fresh fruits and vegetables, whole grains, good sources of protein such as meat, eggs, or tofu, and low-fat dairy products.  Avoid raw meat and unpasteurized juice, milk, and cheese. These carry germs that can harm you and your baby.  If you feel nauseous or you vomit: ? Eat 4 or 5 small meals a day instead of 3 large meals. ? Try eating a few soda crackers. ? Drink liquids between meals instead of during meals.  You may need to take these actions to prevent or treat constipation: ? Drink enough fluid to keep your urine pale yellow. ? Eat foods that are high in fiber, such as beans, whole grains, and fresh fruits and vegetables. ? Limit foods that are high in fat and processed sugars, such as fried or sweet foods. Activity  Exercise only as directed  by your health care provider. Most people can continue their usual exercise routine during pregnancy. Try to exercise for 30 minutes at least 5 days a week.  Stop exercising if you develop pain or cramping in the lower abdomen or lower back.  Avoid exercising if it is very hot or humid or if you are at high altitude.  Avoid heavy lifting.  If you choose to, you may have sex unless your health care provider tells you not to. Relieving pain and discomfort  Wear a good support bra to relieve breast tenderness.  Rest with your legs elevated if you  have leg cramps or low back pain.  If you develop bulging veins (varicose veins) in your legs: ? Wear support hose as told by your health care provider. ? Elevate your feet for 15 minutes, 3-4 times a day. ? Limit salt in your diet. Safety  Wear your seat belt at all times when driving or riding in a car.  Talk with your health care provider if someone is verbally or physically abusive to you.  Talk with your health care provider if you are feeling sad or have thoughts of hurting yourself. Lifestyle  Do not use hot tubs, steam rooms, or saunas.  Do not douche. Do not use tampons or scented sanitary pads.  Do not use herbal remedies, alcohol, illegal drugs, or medicines that are not approved by your health care provider. Chemicals in these products can harm your baby.  Do not use any products that contain nicotine or tobacco, such as cigarettes, e-cigarettes, and chewing tobacco. If you need help quitting, ask your health care provider.  Avoid cat litter boxes and soil used by cats. These carry germs that can cause birth defects in the baby and possibly loss of the unborn baby (fetus) by miscarriage or stillbirth. General instructions  During routine prenatal visits in the first trimester, your health care provider will do a physical exam, perform necessary tests, and ask you how things are going. Keep all follow-up visits. This is important.  Ask for help if you have counseling or nutritional needs during pregnancy. Your health care provider can offer advice or refer you to specialists for help with various needs.  Schedule a dentist appointment. At home, brush your teeth with a soft toothbrush. Floss gently.  Write down your questions. Take them to your prenatal visits. Where to find more information  American Pregnancy Association: americanpregnancy.org  [employer] of [employer]stetricians and Gynecologists: https://www.todd-brady.net/  Office on [employer] Health:  [employer] Contact a health care provider if you have:  Dizziness.  A fever.  Mild pelvic cramps, pelvic pressure, or nagging pain in the abdominal area.  Nausea, vomiting, or diarrhea that lasts for 24 hours or longer.  A bad-smelling vaginal discharge.  Pain when you urinate.  Known exposure to a contagious illness, such as chickenpox, measles, Zika virus, HIV, or hepatitis. Get help right away if you have:  Spotting or bleeding from your vagina.  Severe abdominal cramping or pain.  Shortness of breath or chest pain.  Any kind of trauma, such as from a fall or a car crash.  New or increased pain, swelling, or redness in an arm or leg. Summary  The first trimester of pregnancy starts on the first day of your last menstrual period until the end of week 12 (months 1 through 3).  Eating 4 or 5 small meals a day rather than 3 large meals may help to relieve nausea and vomiting.  Do  not use any products that contain nicotine or tobacco, such as cigarettes, e-cigarettes, and chewing tobacco. If you need help quitting, ask your health care provider.  Keep all follow-up visits. This is important. This information is not intended to replace advice given to you by your health care provider. Make sure you discuss any questions you have with your health care provider. Document Revised: 12/20/2019 Document Reviewed: 10/26/2019 Elsevier Patient Education  2021 [employer].

## 2020-08-22 NOTE — Progress Notes (Signed)
Patient Name: Sara Kelly Date of Birth: 10/01/1995 [facility] Initial Prenatal Visit  Sara Kelly is a 25 y.o. year old G2P1001 at Unknown who presents for her initial prenatal visit. Pregnancy is not planned She reports nausea. She is taking a prenatal vitamin.  She denies pelvic pain or vaginal bleeding.   Pregnancy Dating: . The patient is dated by LMP.  . LMP: 06/22/2021 . Period is certain:  Yes.  . Periods were regular:  Yes.  Marland Kitchen LMP was a typical period:  Yes.  . Using hormonal contraception in 3 months prior to conception: No  . Patient had an early [place] that documented mean sac diameter at [redacted]w[redacted]d and heart rate 92, no crown rump length was available and repeat US needed.  Lab Review: . Blood type: O . Rh Status: + . Antibody screen: Negative . HIV: Negative . RPR: Negative . Hemoglobin electrophoresis reviewed: Yes . Results of OB urine culture are: Negative . Rubella: Immune . Hep C Ab: Negative . Varicella status is Immune  PMH: Reviewed and as detailed below: . HTN: No  . Gestational Hypertension/preeclampsia: No  . Type 1 or 2 Diabetes: No  . Depression:  No  . Seizure disorder:  No . VTE: No ,  . History of STI No,  . Abnormal Pap smear:  No, . Genital herpes simplex:  No   PSH: . Gynecologic Surgery:  no . Surgical history reviewed, notable for: None  Obstetric History: . Obstetric history tab updated and reviewed.  . Summary of prior pregnancies: G2P1001 . Cesarean delivery: No  . Gestational Diabetes:  No . Hypertension in pregnancy: No . History of preterm birth: No . History of LGA/SGA infant:  No . History of shoulder dystocia: No . Indications for referral were reviewed, and the patient has no obstetric indications for referral to High Risk OB Clinic at this time.   Social History: . Partner's name: Desmond Dike  . Tobacco use: No . Alcohol use:  No . Other substance use:  No  Current Medications:  . Prenatal  vitamin  . Reviewed and appropriate in pregnancy.   Genetic and Infection Screen: . Flow Sheet Updated Yes  Prenatal Exam: Gen: Well nourished, well developed.  No distress.  Vitals noted. HEENT: Normocephalic, atraumatic.  Neck supple without cervical lymphadenopathy, thyromegaly or thyroid nodules.  Fair dentition. CV: RRR no murmur, gallops or rubs Lungs: CTA B.  Normal respiratory effort without wheezes or rales. Abd: soft, NTND. +BS.  Uterus not appreciated above pelvis. GU: Normal external female genitalia without lesions.  Nl vaginal, well rugated without lesions. White vaginal discharge.  Bimanual exam: No adnexal mass or TTP. No CMT.  Ext: No clubbing, cyanosis or edema. Psych: Normal grooming and dress.  Not depressed or anxious appearing.  Normal thought content and process without flight of ideas or looseness of associations  Fetal heart tones: Not measured as patient is <10weeks*  Assessment/Plan:  Sara Kelly is a 25 y.o. G2P1001 at Unknown who presents to initiate prenatal care. She is doing well.  Current pregnancy issues include Nausea.  1. Routine prenatal care: Marland Kitchen As dating is not reliable, a dating ultrasound has been ordered. Dating tab updated. . Pre-pregnancy weight updated. Expected weight gain this pregnancy is 25-35 pounds  . Prenatal labs reviewed, notable for low WBC of 2.4. . Indications for referral to [facility] were reviewed and the patient does not meet criteria for referral.  . Medication list reviewed and  updated.  . Recommended patient see a dentist for regular care.  . Bleeding and pain precautions reviewed. . Importance of prenatal vitamins reviewed.  . Genetic screening offered. Patient opted for: patient undecided, will address at future visit. . The patient does not have an indication for aspirin therapy beginning at 12-16 weeks. Aspirin was not  recommended today.  . The patient will not be age 86 or over at time of delivery. Referral to  genetic counseling was not offered today.  . The patient has the following risk factors for preexisting diabetes: Reviewed indications for early 1 hour glucose testing, not indicated . An early 1 hour glucose tolerance test was not ordered. . Pregnancy Medical Home and PHQ-9 forms completed, problems noted: Yes  2. Pregnancy issues include the following which were addressed today:  . Nausea: Pyroxidine prescribed, if not working consider trial of diclegis . Need for repeat US given dating was not obtained by crown rump length and HR on [place] was documented as 92bpm. . Yeast infection- patient prescribed clotrimazole vaginal cream 1% for 7 days   Follow up 4 weeks for next prenatal visit.

## 2020-08-23 ENCOUNTER — Telehealth: Payer: Self-pay | Admitting: Family Medicine

## 2020-08-23 DIAGNOSIS — U071 COVID-19: Secondary | ICD-10-CM

## 2020-08-23 LAB — CBC WITH DIFFERENTIAL/PLATELET
Basophils Absolute: 0 10*3/uL (ref 0.0–0.2)
Basos: 0 %
EOS (ABSOLUTE): 0.1 10*3/uL (ref 0.0–0.4)
Eos: 1 %
Hematocrit: 33.3 % — ABNORMAL LOW (ref 34.0–46.6)
Hemoglobin: 11.1 g/dL (ref 11.1–15.9)
Immature Grans (Abs): 0 10*3/uL (ref 0.0–0.1)
Immature Granulocytes: 0 %
Lymphocytes Absolute: 1.7 10*3/uL (ref 0.7–3.1)
Lymphs: 30 %
MCH: 33 pg (ref 26.6–33.0)
MCHC: 33.3 g/dL (ref 31.5–35.7)
MCV: 99 fL — ABNORMAL HIGH (ref 79–97)
Monocytes Absolute: 0.6 10*3/uL (ref 0.1–0.9)
Monocytes: 11 %
Neutrophils Absolute: 3.1 10*3/uL (ref 1.4–7.0)
Neutrophils: 58 %
Platelets: 251 10*3/uL (ref 150–450)
RBC: 3.36 x10E6/uL — ABNORMAL LOW (ref 3.77–5.28)
RDW: 11.9 % (ref 11.7–15.4)
WBC: 5.5 10*3/uL (ref 3.4–10.8)

## 2020-08-23 LAB — CYTOLOGY - PAP: Diagnosis: NEGATIVE

## 2020-08-23 LAB — CERVICOVAGINAL ANCILLARY ONLY
Chlamydia: NEGATIVE
Comment: NEGATIVE
Comment: NORMAL
Neisseria Gonorrhea: NEGATIVE

## 2020-08-23 NOTE — Telephone Encounter (Signed)
Called patient to discuss results of testing and patient stated understanding. She was concerned because she was just tested for COVID on 1/25 and her results just came back that she is now positive. She states she began having increased congestion and some cough starting on Monday but has had no other symptoms. Due to her pregnancy (first trimester, at [redacted]w[redacted]d today) she is at increased risk for complications and worse disease. I spoke with Dr. Miquel Dunn, who recommended sending in a referral for COVID treatment with Remdesivir to determine if she is eligible and would benefit.  Katrice Goel, DO

## 2020-09-04 ENCOUNTER — Other Ambulatory Visit: Payer: Self-pay

## 2020-09-04 ENCOUNTER — Other Ambulatory Visit: Payer: Self-pay | Admitting: Family Medicine

## 2020-09-04 ENCOUNTER — Ambulatory Visit
Admission: RE | Admit: 2020-09-04 | Discharge: 2020-09-04 | Disposition: A | Discharge status: Home / Self Care | Payer: 59 | Source: Ambulatory Visit | Attending: Family Medicine | Admitting: Family Medicine

## 2020-09-04 DIAGNOSIS — Z3481 Encounter for supervision of other normal pregnancy, first trimester: Secondary | ICD-10-CM

## 2020-09-14 ENCOUNTER — Other Ambulatory Visit: Payer: Self-pay | Admitting: Family Medicine

## 2020-09-14 DIAGNOSIS — O219 Vomiting of pregnancy, unspecified: Secondary | ICD-10-CM

## 2020-09-26 ENCOUNTER — Ambulatory Visit (INDEPENDENT_AMBULATORY_CARE_PROVIDER_SITE_OTHER): Payer: PRIVATE HEALTH INSURANCE | Admitting: Family Medicine

## 2020-09-26 ENCOUNTER — Other Ambulatory Visit: Payer: Self-pay

## 2020-09-26 ENCOUNTER — Ambulatory Visit (INDEPENDENT_AMBULATORY_CARE_PROVIDER_SITE_OTHER): Payer: Medicaid Other

## 2020-09-26 VITALS — BP 110/50 | HR 84 | Wt 139.0 lb

## 2020-09-26 DIAGNOSIS — Z23 Encounter for immunization: Secondary | ICD-10-CM

## 2020-09-26 DIAGNOSIS — Z3482 Encounter for supervision of other normal pregnancy, second trimester: Secondary | ICD-10-CM | POA: Diagnosis not present

## 2020-09-26 DIAGNOSIS — Z31430 Encounter of female for testing for genetic disease carrier status for procreative management: Secondary | ICD-10-CM | POA: Diagnosis not present

## 2020-09-26 DIAGNOSIS — Z3481 Encounter for supervision of other normal pregnancy, first trimester: Secondary | ICD-10-CM

## 2020-09-26 MED ORDER — PRENATAL MULTIVITAMIN CH: 1.0000 | ORAL_TABLET | Freq: Every day | ORAL | Status: DC

## 2020-09-26 NOTE — Progress Notes (Signed)
  Patient Name: Sara Kelly Date of Birth: 04/09/1996 [facility] Prenatal Visit  Sara Kelly is a 25 y.o. G2P1001 at [redacted]w[redacted]d here for routine follow up. She is dated by early ultrasound.  She reports no complaints.  She denies vaginal bleeding.  See flow sheet for details.  Vitals:   09/26/20 1011  BP: (!) 110/50  Pulse: 84     A/P: Pregnancy at [redacted]w[redacted]d.  Doing well.    1. Routine Prenatal Care:  Marland Kitchen Dating reviewed, dating tab is correct . Fetal heart tones Appropriate . Influenza vaccine not administered as patient declined, will continue to discuss.   Marland Kitchen COVID vaccination was discussed and first dose was administered today.  . The patient has the following indication for screening preexisting diabetes: Reviewed indications for early 1 hour glucose testing, not indicated . Marland Kitchen Anatomy ultrasound ordered to be scheduled at 18-20 weeks. . Patient is interested in genetic screening. As she is past 13 weeks and 6 days, a will order anatomy utlrasound.  . Pregnancy education including expected weight gain in pregnancy, OTC medication use, continued use of prenatal vitamin, smoking cessation if applicable, and nutrition in pregnancy.   . Bleeding and pain precautions reviewed.  2. Pregnancy issues include the following and were addressed as appropriate today:  . Importance of influenza vaccination extensively discussed along with the benefits. Patient declines at this time. Will continue to encourage at subsequent visits.  . Patient possibly non-immune to varicella, will get varicella titer at next blood draw. If non-immune, will need varicella vaccine postpartum, discussed this with patient.  . Problem list  and pregnancy box updated: Yes.  . Encouraged patient to take prenatal vitamin daily, she is agreeable.  Marland Kitchen PHQ-9 score of 2 reviewed with negative question 9.   Follow up 3 weeks for 2nd dose of COVID vaccine and then in 4 weeks for next OB visit.

## 2020-09-26 NOTE — Patient Instructions (Addendum)
It was great seeing you today!  I am glad you are doing well!   Congratulations on getting the first dose of your COVID vaccine! It is normal if you do not feel yourself, but this should resolve within 24-48 hours. Please continue to practice safe social distancing practices and wear your wear in public places.  If you decide to get the flu vaccine, please contact our office, we would be happy to administer it.   Go to the [facility] at [facility] if:  You have pain in your lower abdomen or pelvic area  Your water breaks.  Sometimes it is a big gush of fluid, sometimes it is just a trickle that keeps getting your underwear wet or running down your legs  You have vaginal bleeding.  Please follow up in 3 weeks for your 2nd dose of the COVID vaccine and in 4 weeks for your next OB appointment, if anything arises between now and then, please don't hesitate to contact our office.   Thank you for allowing [place] to be a part of your medical care!  Thank you, Dr. Robyne Peers

## 2020-09-26 NOTE — Addendum Note (Signed)
Addended by: Latrelle Dodrill on: 09/26/2020 05:32 PM   Modules accepted: Orders

## 2020-10-03 ENCOUNTER — Telehealth: Payer: Self-pay | Admitting: Family Medicine

## 2020-10-03 ENCOUNTER — Other Ambulatory Visit: Payer: Self-pay | Admitting: Family Medicine

## 2020-10-03 NOTE — Telephone Encounter (Signed)
Called patient to inform her of negative Panorama NIPT result. Also shared gender with her at her request (girl) Still waiting on results of [employer] carrier screening.  Will upload Panorama result to her chart. Patient appreciative Latrelle Dodrill, MD

## 2020-10-13 ENCOUNTER — Other Ambulatory Visit: Payer: Self-pay | Admitting: Family Medicine

## 2020-10-13 DIAGNOSIS — O219 Vomiting of pregnancy, unspecified: Secondary | ICD-10-CM

## 2020-10-17 ENCOUNTER — Other Ambulatory Visit: Payer: Self-pay

## 2020-10-17 ENCOUNTER — Ambulatory Visit (INDEPENDENT_AMBULATORY_CARE_PROVIDER_SITE_OTHER): Payer: PRIVATE HEALTH INSURANCE

## 2020-10-17 DIAGNOSIS — Z23 Encounter for immunization: Secondary | ICD-10-CM | POA: Diagnosis not present

## 2020-10-21 ENCOUNTER — Other Ambulatory Visit: Payer: Self-pay

## 2020-10-28 ENCOUNTER — Other Ambulatory Visit: Payer: Self-pay | Admitting: Family Medicine

## 2020-10-28 DIAGNOSIS — O219 Vomiting of pregnancy, unspecified: Secondary | ICD-10-CM

## 2020-10-29 ENCOUNTER — Other Ambulatory Visit: Payer: Self-pay

## 2020-10-29 ENCOUNTER — Other Ambulatory Visit (HOSPITAL_COMMUNITY)
Admission: RE | Admit: 2020-10-29 | Discharge: 2020-10-29 | Disposition: A | Discharge status: Home / Self Care | Payer: Medicaid Other | Source: Ambulatory Visit | Attending: Family Medicine | Admitting: Family Medicine

## 2020-10-29 ENCOUNTER — Ambulatory Visit (INDEPENDENT_AMBULATORY_CARE_PROVIDER_SITE_OTHER): Payer: PRIVATE HEALTH INSURANCE | Admitting: Family Medicine

## 2020-10-29 ENCOUNTER — Encounter: Payer: Self-pay | Admitting: Family Medicine

## 2020-10-29 VITALS — BP 104/58 | HR 103 | Wt 144.2 lb

## 2020-10-29 DIAGNOSIS — N898 Other specified noninflammatory disorders of vagina: Secondary | ICD-10-CM

## 2020-10-29 DIAGNOSIS — Z3402 Encounter for supervision of normal first pregnancy, second trimester: Secondary | ICD-10-CM

## 2020-10-29 DIAGNOSIS — B373 Candidiasis of vulva and vagina: Secondary | ICD-10-CM | POA: Diagnosis not present

## 2020-10-29 DIAGNOSIS — B3731 Acute candidiasis of vulva and vagina: Secondary | ICD-10-CM

## 2020-10-29 LAB — POCT WET PREP (WET MOUNT)
Clue Cells Wet Prep Whiff POC: NEGATIVE
Trichomonas Wet Prep HPF POC: ABSENT

## 2020-10-29 MED ORDER — FLUCONAZOLE 150 MG PO TABS: 150.0000 mg | ORAL_TABLET | Freq: Once | ORAL | 0 refills | Status: AC

## 2020-10-29 NOTE — Patient Instructions (Addendum)
It was so great seeing you today! Your baby seems to be doing great! I will call you with the results from your exam regarding your vaginal discharge and send in a prescription if needed when I get the results.        Common Medications Safe in Pregnancy  Acne:      Constipation:  Benzoyl Peroxide     Colace  Clindamycin      Dulcolax Suppository  Topica Erythromycin     Fibercon  Salicylic Acid      Metamucil         Miralax AVOID:        Senakot   Accutane    Cough:  Retin-A       Cough Drops  Tetracycline      Phenergan w/ Codeine if Rx  Minocycline      Robitussin (Plain & DM)  Antibiotics:     Crabs/Lice:  Ceclor       RID  Cephalosporins    AVOID:  E-Mycins      Kwell  Keflex  Macrobid/Macrodantin   Diarrhea:  Penicillin      Kao-Pectate  Zithromax      Imodium AD         PUSH FLUIDS AVOID:       Cipro     Fever:  Tetracycline      Tylenol (Regular or Extra  Minocycline       Strength)  Levaquin      Extra Strength-Do not          Exceed 8 tabs/24 hrs Caffeine:        '200mg'$ /day (equiv. To 1 cup of coffee or  approx. 3 12 oz sodas)         Gas: Cold/Hayfever:       Gas-X  Benadryl      Mylicon  Claritin       Phazyme  **Claritin-D        Chlor-Trimeton    Headaches:  Dimetapp      ASA-Free Excedrin  Drixoral-Non-Drowsy     Cold Compress  Mucinex (Guaifenasin)     Tylenol (Regular or Extra  Sudafed/Sudafed-12 Hour     Strength)  **Sudafed PE Pseudoephedrine   Tylenol Cold & Sinus     Vicks Vapor Rub  Zyrtec  **AVOID if Problems With Blood Pressure         Heartburn: Avoid lying down for at least 1 hour after meals  Aciphex      Maalox     Rash:  Milk of Magnesia     Benadryl    Mylanta       1% Hydrocortisone Cream  Pepcid  Pepcid Complete   Sleep Aids:  Prevacid      Ambien   Prilosec       Benadryl  Rolaids       Chamomile Tea  Tums (Limit 4/day)     Unisom         Tylenol PM         Warm milk-add vanilla or  Hemorrhoids:       Sugar  for taste  Anusol/Anusol H.C.  (RX: Analapram 2.5%)  Sugar Substitutes:  Hydrocortisone OTC     Ok in moderation  Preparation H      Tucks        Vaseline lotion applied to tissue with wiping    Herpes:     Throat:  Acyclovir      Oragel  Famvir  Valtrex     Vaccines:         Flu Shot Leg Cramps:       *Gardasil  Benadryl      Hepatitis A         Hepatitis B Nasal Spray:       Pneumovax  Saline Nasal Spray     Polio Booster         Tetanus Nausea:       Tuberculosis test or PPD  Vitamin B6 25 mg TID   AVOID:    Dramamine      *Gardasil  Emetrol       Live Poliovirus  Ginger Root 250 mg QID    MMR (measles, mumps &  High Complex Carbs @ Bedtime    rebella)  Sea Bands-Accupressure    Varicella (Chickenpox)  Unisom 1/2 tab TID     *No known complications           If received before Pain:         Known pregnancy;   Darvocet       Resume series after  Lortab        Delivery  Percocet    Yeast:   Tramadol      Femstat  Tylenol 3      Gyne-lotrimin  Ultram       Monistat  Vicodin           MISC:         All Sunscreens           Hair Coloring/highlights          Insect Repellant's          (Including DEET)         Mystic Tans     Second Trimester of Pregnancy  The second trimester of pregnancy is from week 13 through week 27. This is also called months 4 through 6 of pregnancy. This is often the time when you feel your best. During the second trimester:  Morning sickness is less or has stopped.  You may have more energy.  You may feel hungry more often. At this time, your unborn baby (fetus) is growing very fast. At the end of the sixth month, the unborn baby may be up to 12 inches long and weigh about 1 pounds. You will likely start to feel the baby move between 16 and 20 weeks of pregnancy. Body changes during your second trimester Your body continues to go through many changes during this time. The changes vary and generally return to normal after the baby is  born. Physical changes  You will gain more weight.  You may start to get stretch marks on your hips, belly (abdomen), and breasts.  Your breasts will grow and may hurt.  Dark spots or blotches may develop on your face.  A dark line from your belly button to the pubic area (linea nigra) may appear.  You may have changes in your hair. Health changes  You may have headaches.  You may have heartburn.  You may have trouble pooping (constipation).  You may have hemorrhoids or swollen, bulging veins (varicose veins).  Your gums may bleed.  You may pee (urinate) more often.  You may have back pain. Follow these instructions at home: Medicines  Take over-the-counter and prescription medicines only as told by your doctor. Some medicines are not safe during pregnancy.  Take a prenatal vitamin that contains at least 600 micrograms (mcg) of folic acid.  Eating and drinking  Eat healthy meals that include: ? Fresh fruits and vegetables. ? Whole grains. ? Good sources of protein, such as meat, eggs, or tofu. ? Low-fat dairy products.  Avoid raw meat and unpasteurized juice, milk, and cheese.  You may need to take these actions to prevent or treat trouble pooping: ? Drink enough fluids to keep your pee (urine) pale yellow. ? Eat foods that are high in fiber. These include beans, whole grains, and fresh fruits and vegetables. ? Limit foods that are high in fat and sugar. These include fried or sweet foods. Activity  Exercise only as told by your doctor. Most people can do their usual exercise during pregnancy. Try to exercise for 30 minutes at least 5 days a week.  Stop exercising if you have pain or cramps in your belly or lower back.  Do not exercise if it is too hot or too humid, or if you are in a place of great height (high altitude).  Avoid heavy lifting.  If you choose to, you may have sex unless your doctor tells you not to. Relieving pain and discomfort  Wear a  good support bra if your breasts are sore.  Take warm water baths (sitz baths) to soothe pain or discomfort caused by hemorrhoids. Use hemorrhoid cream if your doctor approves.  Rest with your legs raised (elevated) if you have leg cramps or low back pain.  If you develop bulging veins in your legs: ? Wear support hose as told by your doctor. ? Raise your feet for 15 minutes, 3-4 times a day. ? Limit salt in your food. Safety  Wear your seat belt at all times when you are in a car.  Talk with your doctor if someone is hurting you or yelling at you a lot. Lifestyle  Do not use hot tubs, steam rooms, or saunas.  Do not douche. Do not use tampons or scented sanitary pads.  Avoid cat litter boxes and soil used by cats. These carry germs that can harm your baby and can cause a loss of your baby by miscarriage or stillbirth.  Do not use herbal medicines, illegal drugs, or medicines that are not approved by your doctor. Do not drink alcohol.  Do not smoke or use any products that contain nicotine or tobacco. If you need help quitting, ask your doctor. General instructions  Keep all follow-up visits. This is important.  Ask your doctor about local prenatal classes.  Ask your doctor about the right foods to eat or for help finding a counselor. Where to find more information  American Pregnancy Association: americanpregnancy.org  [employer]: www.acog.org  Office on [employer]: KeywordPortfolios.com.br Contact a doctor if:  You have a headache that does not go away when you take medicine.  You have changes in how you see, or you see spots in front of your eyes.  You have mild cramps, pressure, or pain in your lower belly.  You continue to feel like you may vomit (nauseous), you vomit, or you have watery poop (diarrhea).  You have bad-smelling fluid coming from your vagina.  You have pain when you pee or your pee smells  bad.  You have very bad swelling of your face, hands, ankles, feet, or legs.  You have a fever. Get help right away if:  You are leaking fluid from your vagina.  You have spotting or bleeding from your vagina.  You have very bad belly cramping or pain.  You have trouble breathing.  You have chest pain.  You faint.  You have not felt your baby move for the time period told by your doctor.  You have new or increased pain, swelling, or redness in an arm or leg. Summary  The second trimester of pregnancy is from week 13 through week 27 (months 4 through 6).  Eat healthy meals.  Exercise as told by your doctor. Most people can do their usual exercise during pregnancy.  Do not use herbal medicines, illegal drugs, or medicines that are not approved by your doctor. Do not drink alcohol.  Call your doctor if you get sick or if you notice anything unusual about your pregnancy. This information is not intended to replace advice given to you by your health care provider. Make sure you discuss any questions you have with your health care provider. Document Revised: 12/20/2019 Document Reviewed: 10/26/2019 Elsevier Patient Education  2021 [employer].

## 2020-10-29 NOTE — Progress Notes (Signed)
  Patient Name: [name] Date of Birth: [birth year] [facility] Prenatal Visit  Sara Kelly is a 25 y.o. G2P1001 at [redacted]w[redacted]d here for routine follow up. She is dated by early ultrasound.  She reports no bleeding, no contractions, no cramping, no leaking and does report greenish vaginal discharge that is odorless .  She denies vaginal bleeding.  See flow sheet for details.  Vitals:   10/29/20 1435  BP: (!) 104/58  Pulse: (!) 103     A/P: Pregnancy at [redacted]w[redacted]d.  Doing well.    1. Routine Prenatal Care:  Sara Kelly Kitchen Dating reviewed, dating tab is correct . Fetal heart tones Appropriate . Influenza vaccine not administered as patient declined, will continue to discuss.  Does not want the flu vaccine . COVID vaccination was discussed and previously given  . The patient has the following indication for screening preexisting diabetes: Reviewed indications for early 1 hour glucose testing, not indicated . Sara Kelly Kitchen Anatomy ultrasound ordered to be scheduled at 18-20 weeks. . Patient completed NIPT which was low risk . Pregnancy education including expected weight gain in pregnancy, OTC medication use, continued use of prenatal vitamin, smoking cessation if applicable, and nutrition in pregnancy.   . Bleeding and pain precautions reviewed.  2. Pregnancy issues include the following and were addressed as appropriate today:  . Vaginal discharge: yellow/light green odorless discharge. Discharge testing positive for yeast and bacteria, negative for clue cells. Gc/Ch also collected per patient request and concern.  Will treat with 1 dose of 150mg  fluconazole as patient is in the second trimester and the risk of defect is low per FDA information (discussed with Dr. in the office). Attempted to call patient with results and discuss the concerns regarding treatment with fluconazole, but unable to reach patient--prescription sent in and will attempt again. . Problem list  and pregnancy box updated:  Yes.   Follow up 4 weeks.

## 2020-10-30 ENCOUNTER — Telehealth: Payer: Self-pay | Admitting: Family Medicine

## 2020-10-30 LAB — CERVICOVAGINAL ANCILLARY ONLY
Chlamydia: NEGATIVE
Comment: NEGATIVE
Comment: NORMAL
Neisseria Gonorrhea: NEGATIVE

## 2020-10-30 NOTE — Telephone Encounter (Signed)
Spoke with patient regarding recent results showing yeast infection. Patient now aware of prescription for one time low-dose fluconazole. Discussed remote risks of fetal defects, which have not been correlated with one-time low doses of fluconazole after the first trimester. Patient instructed to contact the office within the next week if symptoms persist.  Diannia Hogenson, DO

## 2020-11-04 ENCOUNTER — Ambulatory Visit: Payer: 59 | Attending: Obstetrics and Gynecology

## 2020-11-04 ENCOUNTER — Other Ambulatory Visit: Payer: Self-pay

## 2020-11-04 DIAGNOSIS — Z3481 Encounter for supervision of other normal pregnancy, first trimester: Secondary | ICD-10-CM | POA: Insufficient documentation

## 2020-11-05 ENCOUNTER — Other Ambulatory Visit: Payer: Self-pay | Admitting: *Deleted

## 2020-11-05 DIAGNOSIS — Z362 Encounter for other antenatal screening follow-up: Secondary | ICD-10-CM

## 2020-11-25 ENCOUNTER — Other Ambulatory Visit: Payer: Self-pay

## 2020-11-25 ENCOUNTER — Ambulatory Visit (INDEPENDENT_AMBULATORY_CARE_PROVIDER_SITE_OTHER): Payer: PRIVATE HEALTH INSURANCE | Admitting: Family Medicine

## 2020-11-25 ENCOUNTER — Encounter: Payer: Self-pay | Admitting: Family Medicine

## 2020-11-25 VITALS — BP 120/70 | HR 89 | Wt 153.4 lb

## 2020-11-25 DIAGNOSIS — Z3402 Encounter for supervision of normal first pregnancy, second trimester: Secondary | ICD-10-CM

## 2020-11-25 NOTE — Patient Instructions (Addendum)
Everything appears to be going well with the pregnancy. If you have any questions or concerns please call our office at [phone]. At your next visit, we will be collecting blood work and will be doing a 1hour sugar test.   If you have any loss/gush of fluid, vaginal bleeding, or concerning contractions please go to the MAU (maternal assessment unit) at [facility] for further evaluation.      Second Trimester of Pregnancy  The second trimester of pregnancy is from week 13 through week 27. This is also called months 4 through 6 of pregnancy. This is often the time when you feel your best. During the second trimester:  Morning sickness is less or has stopped.  You may have more energy.  You may feel hungry more often. At this time, your unborn baby (fetus) is growing very fast. At the end of the sixth month, the unborn baby may be up to 12 inches long and weigh about 1 pounds. You will likely start to feel the baby move between 16 and 20 weeks of pregnancy. Body changes during your second trimester Your body continues to go through many changes during this time. The changes vary and generally return to normal after the baby is born. Physical changes  You will gain more weight.  You may start to get stretch marks on your hips, belly (abdomen), and breasts.  Your breasts will grow and may hurt.  Dark spots or blotches may develop on your face.  A dark line from your belly button to the pubic area (linea nigra) may appear.  You may have changes in your hair. Health changes  You may have headaches.  You may have heartburn.  You may have trouble pooping (constipation).  You may have hemorrhoids or swollen, bulging veins (varicose veins).  Your gums may bleed.  You may pee (urinate) more often.  You may have back pain. Follow these instructions at home: Medicines  Take over-the-counter and prescription medicines only as told by your doctor. Some medicines are  not safe during pregnancy.  Take a prenatal vitamin that contains at least 600 micrograms (mcg) of folic acid. Eating and drinking  Eat healthy meals that include: ? Fresh fruits and vegetables. ? Whole grains. ? Good sources of protein, such as meat, eggs, or tofu. ? Low-fat dairy products.  Avoid raw meat and unpasteurized juice, milk, and cheese.  You may need to take these actions to prevent or treat trouble pooping: ? Drink enough fluids to keep your pee (urine) pale yellow. ? Eat foods that are high in fiber. These include beans, whole grains, and fresh fruits and vegetables. ? Limit foods that are high in fat and sugar. These include fried or sweet foods. Activity  Exercise only as told by your doctor. Most people can do their usual exercise during pregnancy. Try to exercise for 30 minutes at least 5 days a week.  Stop exercising if you have pain or cramps in your belly or lower back.  Do not exercise if it is too hot or too humid, or if you are in a place of great height (high altitude).  Avoid heavy lifting.  If you choose to, you may have sex unless your doctor tells you not to. Relieving pain and discomfort  Wear a good support bra if your breasts are sore.  Take warm water baths (sitz baths) to soothe pain or discomfort caused by hemorrhoids. Use hemorrhoid cream if your doctor approves.  Rest with your  legs raised (elevated) if you have leg cramps or low back pain.  If you develop bulging veins in your legs: ? Wear support hose as told by your doctor. ? Raise your feet for 15 minutes, 3-4 times a day. ? Limit salt in your food. Safety  Wear your seat belt at all times when you are in a car.  Talk with your doctor if someone is hurting you or yelling at you a lot. Lifestyle  Do not use hot tubs, steam rooms, or saunas.  Do not douche. Do not use tampons or scented sanitary pads.  Avoid cat litter boxes and soil used by cats. These carry germs that can  harm your baby and can cause a loss of your baby by miscarriage or stillbirth.  Do not use herbal medicines, illegal drugs, or medicines that are not approved by your doctor. Do not drink alcohol.  Do not smoke or use any products that contain nicotine or tobacco. If you need help quitting, ask your doctor. General instructions  Keep all follow-up visits. This is important.  Ask your doctor about local prenatal classes.  Ask your doctor about the right foods to eat or for help finding a counselor. Where to find more information  American Pregnancy Association: americanpregnancy.org  [employer]: www.acog.org  Office on [employer] [facility]: MightyReward.co.nz Contact a doctor if:  You have a headache that does not go away when you take medicine.  You have changes in how you see, or you see spots in front of your eyes.  You have mild cramps, pressure, or pain in your lower belly.  You continue to feel like you may vomit (nauseous), you vomit, or you have watery poop (diarrhea).  You have bad-smelling fluid coming from your vagina.  You have pain when you pee or your pee smells bad.  You have very bad swelling of your face, hands, ankles, feet, or legs.  You have a fever. Get help right away if:  You are leaking fluid from your vagina.  You have spotting or bleeding from your vagina.  You have very bad belly cramping or pain.  You have trouble breathing.  You have chest pain.  You faint.  You have not felt your baby move for the time period told by your doctor.  You have new or increased pain, swelling, or redness in an arm or leg. Summary  The second trimester of pregnancy is from week 13 through week 27 (months 4 through 6).  Eat healthy meals.  Exercise as told by your doctor. Most people can do their usual exercise during pregnancy.  Do not use herbal medicines, illegal drugs, or medicines that are not approved  by your doctor. Do not drink alcohol.  Call your doctor if you get sick or if you notice anything unusual about your pregnancy. This information is not intended to replace advice given to you by your health care provider. Make sure you discuss any questions you have with your health care provider. Document Revised: 12/20/2019 Document Reviewed: 10/26/2019 Elsevier Patient Education  2021 Elsevier Inc.      Round Ligament Pain  The round ligament is a cord of muscle and tissue that helps support the uterus. It can become a source of pain during pregnancy if it becomes stretched or twisted as the baby grows. The pain usually begins in the second trimester (13-28 weeks) of pregnancy, and it can come and go until the baby is delivered. It is not  a serious problem, and it does not cause harm to the baby. Round ligament pain is usually a short, sharp, and pinching pain, but it can also be a dull, lingering, and aching pain. The pain is felt in the lower side of the abdomen or in the groin. It usually starts deep in the groin and moves up to the outside of the hip area. The pain may occur when you:  Suddenly change position, such as quickly going from a sitting to standing position.  Roll over in bed.  Cough or sneeze.  Do physical activity. Follow these instructions at home:  Watch your condition for any changes.  When the pain starts, relax. Then try any of these methods to help with the pain: ? Sitting down. ? Flexing your knees up to your abdomen. ? Lying on your side with one pillow under your abdomen and another pillow between your legs. ? Sitting in a warm bath for 15-20 minutes or until the pain goes away.  Take over-the-counter and prescription medicines only as told by your health care provider.  Move slowly when you sit down or stand up.  Avoid long walks if they cause pain.  Stop or reduce your physical activities if they cause pain.  Keep all follow-up visits as told  by your health care provider. This is important.   Contact a health care provider if:  Your pain does not go away with treatment.  You feel pain in your back that you did not have before.  Your medicine is not helping. Get help right away if:  You have a fever or chills.  You develop uterine contractions.  You have vaginal bleeding.  You have nausea or vomiting.  You have diarrhea.  You have pain when you urinate. Summary  Round ligament pain is felt in the lower abdomen or groin. It is usually a short, sharp, and pinching pain. It can also be a dull, lingering, and aching pain.  This pain usually begins in the second trimester (13-28 weeks). It occurs because the uterus is stretching with the growing baby, and it is not harmful to the baby.  You may notice the pain when you suddenly change position, when you cough or sneeze, or during physical activity.  Relaxing, flexing your knees to your abdomen, lying on one side, or taking a warm bath may help to get rid of the pain.  Get help from your health care provider if the pain does not go away or if you have vaginal bleeding, nausea, vomiting, diarrhea, or painful urination. This information is not intended to replace advice given to you by your health care provider. Make sure you discuss any questions you have with your health care provider. Document Revised: 12/29/2017 Document Reviewed: 12/29/2017 Elsevier Patient Education  2021 [employer].

## 2020-11-25 NOTE — Progress Notes (Signed)
  [facility] Prenatal Visit  Sara Kelly is a 25 y.o. G2P1001 at [redacted]w[redacted]d here for routine follow up. She is dated by early ultrasound.  She reports no complaints.  She reports good fetal movement. No bleeding, loss of fluid, contractions. See flow sheet for details. There were no vitals filed for this visit.   A/P: Pregnancy at 104w2d.  Doing well.   . Dating reviewed, dating tab is correct . Fetal heart tones Appropriate . Fundal height within expected range.  . Anatomy ultrasound reviewed and notable for echogenic intracardiac focus and sub-optimal views of anatomy, follow-up US on 12/09/2020.  . Influenza vaccine not administered as patient declined, will continue to discuss. .  . COVID vaccination was discussed and patient has had both COVID vaccines.  . Indications for screening for preexisting diabetes include: Reviewed indications for early 1 hour glucose testing, not indicated .  Marland Kitchen Pregnancy education provided on the following topics: fetal growth and movement, ultrasound assessment, and upcoming laboratory assessment.   . To be scheduled for [facility] during second trimester at next visit. . Preterm labor precautions given.   2. Pregnancy issues include the following and were addressed as appropriate today:  . Follow-up US for echogenic intracardiac focus and more complete fetal anatomy. . Problem list and pregnancy box updated: Yes.     Follow up 4 weeks. Patient will need labs drawn and 1hr GTT.

## 2020-12-09 ENCOUNTER — Ambulatory Visit: Payer: PRIVATE HEALTH INSURANCE | Attending: Maternal & Fetal Medicine

## 2020-12-09 ENCOUNTER — Ambulatory Visit: Payer: PRIVATE HEALTH INSURANCE

## 2020-12-19 ENCOUNTER — Other Ambulatory Visit: Payer: Self-pay

## 2020-12-19 ENCOUNTER — Ambulatory Visit: Payer: Medicaid Other | Admitting: *Deleted

## 2020-12-19 ENCOUNTER — Encounter: Payer: Self-pay | Admitting: *Deleted

## 2020-12-19 ENCOUNTER — Ambulatory Visit: Payer: Medicaid Other | Attending: Maternal & Fetal Medicine

## 2020-12-19 VITALS — BP 119/65 | HR 90

## 2020-12-19 DIAGNOSIS — O283 Abnormal ultrasonic finding on antenatal screening of mother: Secondary | ICD-10-CM | POA: Insufficient documentation

## 2020-12-19 DIAGNOSIS — Z362 Encounter for other antenatal screening follow-up: Secondary | ICD-10-CM | POA: Diagnosis not present

## 2020-12-19 DIAGNOSIS — Z3A25 25 weeks gestation of pregnancy: Secondary | ICD-10-CM | POA: Diagnosis not present

## 2020-12-24 ENCOUNTER — Ambulatory Visit (INDEPENDENT_AMBULATORY_CARE_PROVIDER_SITE_OTHER): Payer: 59 | Admitting: Family Medicine

## 2020-12-24 ENCOUNTER — Other Ambulatory Visit: Payer: Self-pay

## 2020-12-24 VITALS — BP 120/80 | HR 97 | Wt 160.0 lb

## 2020-12-24 DIAGNOSIS — Z3A26 26 weeks gestation of pregnancy: Secondary | ICD-10-CM

## 2020-12-24 DIAGNOSIS — Z3492 Encounter for supervision of normal pregnancy, unspecified, second trimester: Secondary | ICD-10-CM

## 2020-12-24 NOTE — Patient Instructions (Addendum)
The pain in your abdomen could possibly be gas though I am not complete sure, I don't think it is anything alarming or something to worry about right now so we will keep an eye on it. You can try Tylenol to see if that helps the pain and gas medications to see if that helps.   Otherwise, everything looks great. We are drawing labs today to check for diabetes during pregnancy, your blood counts, and other routine tests including syphilis and HIV (they are done with every pregnancy). At your next visit we will need to get the Tdap vaccine for you, which is given in every pregnancy.

## 2020-12-24 NOTE — Progress Notes (Signed)
  [facility] Prenatal Visit  Sara Kelly is a 25 y.o. G2P1001 at [redacted]w[redacted]d here for routine follow up. She is dated by early ultrasound.  She reports sharp pain in left upper abdomen for the last 3 weeks that wakes her from sleep.. She reports fetal movement. Denies vaginal bleeding, loss of fluid, or contractions.  See flow sheet for details.  A/P: Pregnancy at [redacted]w[redacted]d.  Doing well.   . Dating reviewed, dating tab is correct . Fetal heart tones Appropriate . Fundal height within expected range.  . Influenza vaccine not administered as not influenza season.   Marland Kitchen COVID vaccination was discussed and patient has received both doses.  . Screening for gestational diabetes completed today and will follow up results .  Marland Kitchen Pregnancy education completed including: fetal growth, breastfeeding, contraception, and expected weight gain in pregnancy.   . The patient does not have a history of Cesarean delivery and no referral to [facility] for [facility] is indicated . Scheduled for [facility] during second trimester on 01/16/2021. . Preterm labor, bleeding, and pain precautions given.    2. Pregnancy issues include the following and were addressed as appropriate today:  . Left upper abdomen pain, unsure exact etiology. Consideration that it could be gas vs infant positioning. No other symptoms besides pain. Patient advised to trial Tylenol to see if improvement . Problem list and pregnancy box updated: Yes.   Follow up with Ob clinic on 01/16/2021.

## 2020-12-25 LAB — HIV ANTIBODY (ROUTINE TESTING W REFLEX): HIV Screen 4th Generation wRfx: NONREACTIVE

## 2020-12-25 LAB — CBC
Hematocrit: 31.4 % — ABNORMAL LOW (ref 34.0–46.6)
Hemoglobin: 10.3 g/dL — ABNORMAL LOW (ref 11.1–15.9)
MCH: 33.4 pg — ABNORMAL HIGH (ref 26.6–33.0)
MCHC: 32.8 g/dL (ref 31.5–35.7)
MCV: 102 fL — ABNORMAL HIGH (ref 79–97)
Platelets: 203 10*3/uL (ref 150–450)
RBC: 3.08 x10E6/uL — ABNORMAL LOW (ref 3.77–5.28)
RDW: 11.3 % — ABNORMAL LOW (ref 11.7–15.4)
WBC: 8.1 10*3/uL (ref 3.4–10.8)

## 2020-12-25 LAB — RPR: RPR Ser Ql: NONREACTIVE

## 2020-12-25 LAB — GLUCOSE TOLERANCE, 1 HOUR: Glucose, 1Hr PP: 90 mg/dL (ref 65–199)

## 2021-01-11 ENCOUNTER — Encounter (HOSPITAL_COMMUNITY): Payer: Self-pay

## 2021-01-11 ENCOUNTER — Other Ambulatory Visit: Payer: Self-pay

## 2021-01-11 ENCOUNTER — Inpatient Hospital Stay (HOSPITAL_COMMUNITY)
Admission: AD | Admit: 2021-01-11 | Discharge: 2021-01-11 | Disposition: A | Discharge status: Home / Self Care | Payer: Medicaid Other | Attending: Family Medicine | Admitting: Family Medicine

## 2021-01-11 DIAGNOSIS — Z3689 Encounter for other specified antenatal screening: Secondary | ICD-10-CM | POA: Insufficient documentation

## 2021-01-11 DIAGNOSIS — O99891 Other specified diseases and conditions complicating pregnancy: Secondary | ICD-10-CM

## 2021-01-11 DIAGNOSIS — R1012 Left upper quadrant pain: Secondary | ICD-10-CM | POA: Insufficient documentation

## 2021-01-11 DIAGNOSIS — Z87891 Personal history of nicotine dependence: Secondary | ICD-10-CM | POA: Diagnosis not present

## 2021-01-11 DIAGNOSIS — Z79899 Other long term (current) drug therapy: Secondary | ICD-10-CM | POA: Insufficient documentation

## 2021-01-11 DIAGNOSIS — Z3683 Encounter for fetal screening for congenital cardiac abnormalities: Secondary | ICD-10-CM | POA: Diagnosis not present

## 2021-01-11 DIAGNOSIS — O26893 Other specified pregnancy related conditions, third trimester: Secondary | ICD-10-CM | POA: Insufficient documentation

## 2021-01-11 DIAGNOSIS — Z3A29 29 weeks gestation of pregnancy: Secondary | ICD-10-CM | POA: Diagnosis not present

## 2021-01-11 DIAGNOSIS — R109 Unspecified abdominal pain: Secondary | ICD-10-CM | POA: Diagnosis not present

## 2021-01-11 DIAGNOSIS — M7918 Myalgia, other site: Secondary | ICD-10-CM | POA: Diagnosis not present

## 2021-01-11 LAB — URINALYSIS, ROUTINE W REFLEX MICROSCOPIC
Bilirubin Urine: NEGATIVE
Glucose, UA: NEGATIVE mg/dL
Hgb urine dipstick: NEGATIVE
Ketones, ur: NEGATIVE mg/dL
Leukocytes,Ua: NEGATIVE
Nitrite: NEGATIVE
Protein, ur: NEGATIVE mg/dL
Specific Gravity, Urine: 1.013 (ref 1.005–1.030)
pH: 6 (ref 5.0–8.0)

## 2021-01-11 MED ORDER — ACETAMINOPHEN 500 MG PO TABS
1000.0000 mg | ORAL_TABLET | Freq: Four times a day (QID) | ORAL | Status: DC | PRN
  Administered 2021-01-11: 1000 mg via ORAL
  Filled 2021-01-11: qty 2

## 2021-01-11 MED ORDER — CYCLOBENZAPRINE HCL 5 MG PO TABS
10.0000 mg | ORAL_TABLET | Freq: Three times a day (TID) | ORAL | Status: DC | PRN
  Administered 2021-01-11: 10 mg via ORAL
  Filled 2021-01-11: qty 2

## 2021-01-11 MED ORDER — CYCLOBENZAPRINE HCL 10 MG PO TABS: 10.0000 mg | ORAL_TABLET | Freq: Three times a day (TID) | ORAL | 0 refills | Status: DC | PRN

## 2021-01-11 MED ORDER — ACETAMINOPHEN 500 MG PO TABS: 1000.0000 mg | ORAL_TABLET | Freq: Four times a day (QID) | ORAL | 0 refills | Status: DC | PRN

## 2021-01-11 NOTE — ED Provider Notes (Signed)
Emergency Medicine Provider OB Triage Evaluation Note  Sara Kelly is a 25 y.o. female, G2P1001, at [redacted]w[redacted]d gestation who presents to the emergency department with complaints of intermittent left flank/LUQ abdominal pain for the past 2 weeks now moving to her back. No chest pain or SOB. No urinary symptoms. No vaginal bleeding or rush of fluids. .  Review of  Systems  Positive: + abdominal pain, back pain Negative: - urinary or vaginal complaints  Physical Exam  BP 112/68 (BP Location: Left Arm)   Pulse 99   Temp 98.5 F (36.9 C) (Oral)   Resp 18   LMP 06/22/2020 (Exact Date)   SpO2 100%  General: Awake, no distress  HEENT: Atraumatic  Resp: Normal effort  Cardiac: Normal rate Abd: Nondistended, nontender  MSK: Moves all extremities without difficulty Neuro: Speech clear  Medical Decision Making  Pt evaluated for pregnancy concern and is stable for transfer to MAU. Pt is in agreement with plan for transfer.  3:31 PM Discussed with MAU APP, Shawna Orleans, who accepts patient in transfer.  Clinical Impression  No diagnosis found.     Tanda Rockers, PA-C 01/11/21 1536    Koleen Distance, MD 01/12/21 1153

## 2021-01-11 NOTE — MAU Provider Note (Signed)
History     CSN: [id number]  Arrival date and time: 01/11/21 1512   Event Date/Time   First Provider Initiated Contact with Patient 01/11/21 1652      Chief Complaint  Patient presents with   Abdominal Pain   24 y.o. G2P1001 @29 .0 wks presenting with LUQ pain and left back pain. Reports onset of LUQ pain about 1 mos ago. Pain is intermittent. Worse when she sits up, better when lying down. Rates pain 7/10. Has not tried anything for it. Pain moved into the left side of her back recently. Denies urinary sx. No N/V. No fevers. She does report an injury in which her leg slipped down between slats of a wooden palate while she was standing on it at work. She cannot recall exactly when it happened but was before this pain started.    OB History     Gravida  2   Para  1   Term  1   Preterm      AB      Living  1      SAB      IAB      Ectopic      Multiple  0   Live Births  1           Past Medical History:  Diagnosis Date   Boil of buttock    Normal labor 03/14/2017    Past Surgical History:  Procedure Laterality Date   NO PAST SURGERIES      Family History  Problem Relation Age of Onset   Healthy Mother    Healthy Father    Diabetes Sister     Social History   Tobacco Use   Smoking status: Former    Pack years: 0.00    Types: Cigars    Quit date: 07/27/2016    Years since quitting: 4.4   Smokeless tobacco: Never  Substance Use Topics   Alcohol use: No    Comment: quit with +preg   Drug use: Yes    Types: Marijuana    Comment: Stopped once pregnant    Allergies:  Allergies  Allergen Reactions   Amoxicillin Hives    Has patient had a PCN reaction causing immediate rash, facial/tongue/throat swelling, SOB or lightheadedness with hypotension: No Has patient had a PCN reaction causing severe rash involving mucus membranes or skin necrosis: No Has patient had a PCN reaction that required hospitalization: No Has patient had a PCN reaction  occurring within the last 10 years: Yes If all of the above answers are "NO", then may proceed with Cephalosporin use.    Penicillins Hives    Has patient had a PCN reaction causing immediate rash, facial/tongue/throat swelling, SOB or lightheadedness with hypotension: No Has patient had a PCN reaction causing severe rash involving mucus membranes or skin necrosis: No Has patient had a PCN reaction that required hospitalization: No Has patient had a PCN reaction occurring within the last 10 years: Yes If all of the above answers are "NO", then may proceed with Cephalosporin use.    Medications Prior to Admission  Medication Sig Dispense Refill Last Dose   Prenatal Vit-Fe Fumarate-FA (PRENATAL MULTIVITAMIN) TABS tablet Take 1 tablet by mouth daily.   01/04/2021   vitamin B-6 (PYRIDOXINE) 25 MG tablet TAKE 1 TABLET (25 MG TOTAL) BY MOUTH DAILY. 90 tablet 1    Review of Systems  Constitutional:  Negative for chills and fever.  Gastrointestinal:  Positive for abdominal pain. Negative for  nausea and vomiting.  Genitourinary:  Negative for dysuria, frequency, urgency and vaginal bleeding.  Musculoskeletal:  Positive for back pain.  Physical Exam   Blood pressure 108/69, pulse (!) 105, temperature 98.6 F (37 C), temperature source Oral, resp. rate 18, height 5\' 4"  (1.626 m), weight 75 kg, last menstrual period 06/22/2020, SpO2 99 %, unknown if currently breastfeeding.  Physical Exam Vitals reviewed.  Constitutional:      Appearance: Normal appearance.  HENT:     Head: Normocephalic and atraumatic.  Pulmonary:     Effort: Pulmonary effort is normal. No respiratory distress.  Abdominal:     Palpations: Abdomen is soft.     Tenderness: There is no abdominal tenderness. There is no right CVA tenderness, left CVA tenderness, guarding or rebound.     Comments: gravid  Musculoskeletal:        General: Normal range of motion.     Cervical back: Normal and normal range of motion.      Thoracic back: Normal.     Lumbar back: Normal.  Skin:    General: Skin is warm and dry.  Neurological:     General: No focal deficit present.     Mental Status: She is alert and oriented to person, place, and time.  Psychiatric:        Mood and Affect: Mood normal.        Behavior: Behavior normal.  EFM: 145 bpm, mod variability, + accels, no decels Toco: rare  Results for orders placed or performed during the hospital encounter of 01/11/21 (from the past 24 hour(s))  Urinalysis, Routine w reflex microscopic Urine, Clean Catch     Status: Abnormal   Collection Time: 01/11/21  4:17 PM  Result Value Ref Range   Color, Urine YELLOW YELLOW   APPearance HAZY (A) CLEAR   Specific Gravity, Urine 1.013 1.005 - 1.030   pH 6.0 5.0 - 8.0   Glucose, UA NEGATIVE NEGATIVE mg/dL   Hgb urine dipstick NEGATIVE NEGATIVE   Bilirubin Urine NEGATIVE NEGATIVE   Ketones, ur NEGATIVE NEGATIVE mg/dL   Protein, ur NEGATIVE NEGATIVE mg/dL   Nitrite NEGATIVE NEGATIVE   Leukocytes,Ua NEGATIVE NEGATIVE   MAU Course  Procedures Tylenol Flexeril Heating pad  MDM Labs ordered and reviewed. No signs of UTI, pyelo or stones. Pain resolved after meds and heat. Suspect MSK strain, possibly from her accident at work. Discussed comfort measures. Stable for discharge home.   Assessment and Plan   1. [redacted] weeks gestation of pregnancy   2. Pain in abdominal muscle of left flank   3. NST (non-stress test) reactive    Discharge home Follow up at [facility] as scheduled Rx Flexeril Rx Tylenol Heating pad prn Return precautions  Allergies as of 01/11/2021       Reactions   Amoxicillin Hives   Has patient had a PCN reaction causing immediate rash, facial/tongue/throat swelling, SOB or lightheadedness with hypotension: No Has patient had a PCN reaction causing severe rash involving mucus membranes or skin necrosis: No Has patient had a PCN reaction that required hospitalization: No Has patient had a PCN reaction  occurring within the last 10 years: Yes If all of the above answers are "NO", then may proceed with Cephalosporin use.   Penicillins Hives   Has patient had a PCN reaction causing immediate rash, facial/tongue/throat swelling, SOB or lightheadedness with hypotension: No Has patient had a PCN reaction causing severe rash involving mucus membranes or skin necrosis: No Has patient had a PCN  reaction that required hospitalization: No Has patient had a PCN reaction occurring within the last 10 years: Yes If all of the above answers are "NO", then may proceed with Cephalosporin use.        Medication List     TAKE these medications    acetaminophen 500 MG tablet Commonly known as: TYLENOL Take 2 tablets (1,000 mg total) by mouth every 6 (six) hours as needed for moderate pain.   cyclobenzaprine 10 MG tablet Commonly known as: FLEXERIL Take 1 tablet (10 mg total) by mouth 3 (three) times daily as needed (back pain).   prenatal multivitamin Tabs tablet Take 1 tablet by mouth daily.   vitamin B-6 25 MG tablet Commonly known as: pyridOXINE TAKE 1 TABLET (25 MG TOTAL) BY MOUTH DAILY.       Donette Larry, CNM 01/11/2021, 5:10 PM

## 2021-01-11 NOTE — MAU Note (Signed)
Presents with c/o pain in upper left abdomen under breast x1 month, pain is radiating into back.  Denies VB or LOF.  Endorses +FM.

## 2021-01-11 NOTE — ED Triage Notes (Signed)
Patient complains of abdominal pain with back pain x 2-3 weeks. G2, P1. Patient denies leakage of fluids, no bleeding. No nausea, no vomiting. Denis dysuria

## 2021-01-14 NOTE — Progress Notes (Signed)
[facility] Visit  Sara Kelly is a 25 y.o. G2P1001 at [redacted]w[redacted]d (via LMP c/w 10 wk sono) who presents to [facility] for routine follow up. Prenatal course, history, notes, ultrasounds, and laboratory results reviewed.  Denies cramping/ctx, fluid leaking, vaginal bleeding, or decreased fetal movement. Taking PNV.    Primary Prenatal Care Provider: Dr Clayborne Artist  Postpartum Plans: - delivery planning: SVD, not wanting epidural this time - circumcision: n/a female - feeding: breast - pediatrician: [facility] - contraception: POPs vs patches (has tried Depo and Nexplanon in the past and did not like with irregular bleeding)  FHR: 142 bpm Uterine size: 30 cm  Assessment & Plan  1. Routine prenatal care: - Tdap vaccine given today - Started aspirin 81 mg after shared decision making about decreasing risk of elevated blood pressure and pre-eclampsia in pregnancy  2. Anemia of pregnancy- Hgb 10.3 at last visit, starting PO ferrous sulfate every other day  3. LUQ pain- sounding MSK in nature, recent labs and work up at MAU negative for UTI or stone, no CVA tenderness or systemic symptoms today, denies fevers, chills, vomiting. Discussed return precautions. PRN apap for pain.  4. Echogenic intracardiac focus- noted on repeat ultrasound, low risk NIPS, negative carrier screening, declined diagnostic testing/other work-up.  Next prenatal visit in 2 weeks . Labor & fetal movement precautions discussed. Pre-eclampsia precautions discussed.  [name], MD [facility]

## 2021-01-16 ENCOUNTER — Other Ambulatory Visit: Payer: Self-pay

## 2021-01-16 ENCOUNTER — Ambulatory Visit (INDEPENDENT_AMBULATORY_CARE_PROVIDER_SITE_OTHER): Payer: Medicaid Other | Admitting: Family Medicine

## 2021-01-16 VITALS — BP 106/63 | HR 94 | Wt 164.0 lb

## 2021-01-16 DIAGNOSIS — Z23 Encounter for immunization: Secondary | ICD-10-CM

## 2021-01-16 DIAGNOSIS — Z3402 Encounter for supervision of normal first pregnancy, second trimester: Secondary | ICD-10-CM

## 2021-01-16 DIAGNOSIS — O99013 Anemia complicating pregnancy, third trimester: Secondary | ICD-10-CM

## 2021-01-16 MED ORDER — FERROUS SULFATE 325 (65 FE) MG PO TBEC: 325.0000 mg | DELAYED_RELEASE_TABLET | ORAL | 1 refills | Status: DC

## 2021-01-16 MED ORDER — ASPIRIN EC 81 MG PO TBEC: 81.0000 mg | DELAYED_RELEASE_TABLET | Freq: Every day | ORAL | 3 refills | Status: DC

## 2021-01-16 NOTE — Patient Instructions (Addendum)
It was wonderful to see you today.  Please bring ALL of your medications with you to every visit.   Today we talked about:  - starting iron pill every other day for anemia  - T dap vaccine given today   - start baby aspirin 81 mg daily to help prevent high blood pressure in pregnancy   - Please schedule a follow up appointment in 2 weeks    Pregnancy Related Return Precautions The follow are signs/symptoms that are abnormal in pregnancy and may require further evaluation by a physician: Go to the MAU at [facility] if: You have cramping/contractions that do not go away with drinking water, especially if they are lasting 30 seconds to 1.5 minutes, coming and going every 5-10 minutes for an hour or more, or are getting stronger and you cannot walk or talk while having a contraction/cramp. Your water breaks.  Sometimes it is a big gush of fluid, sometimes it is just a trickle that keeps getting your underwear wet or running down your legs You have vaginal bleeding.    You do not feel your baby moving like normal.  If you do not, get something to eat and drink (something cold or something with sugar like peanut butter or juice) and lay down and focus on feeling your baby move. If your baby is still not moving like normal, you should go to MAU. You should feel your baby move 6 times in one hour, or 10 times in two hours. You have a persistent headache that does not go away with 1 g of Tylenol, vision changes, chest pain, difficulty breathing, severe pain in your right upper abdomen, worsening leg swelling- these can all be signs of high blood pressure in pregnancy and need to be evaluated by a provider immediately  These are all concerning in pregnancy and if you have any of these I recommend you call your PCP and present to the Maternity Admissions Unit (map below) for further evaluation.  For any pregnancy-related emergencies, please go to the Maternity Admissions  Unit in the [facility] at [facility]. You will use hospital Entrance C.      Thank you for choosing [facility].   Please call [phone] with any questions about today's appointment.  Please be sure to schedule follow up at the front  desk before you leave today.   [name], MD  Family Medicine

## 2021-01-28 ENCOUNTER — Encounter: Payer: Self-pay | Admitting: Family Medicine

## 2021-01-28 ENCOUNTER — Other Ambulatory Visit: Payer: Self-pay

## 2021-01-28 ENCOUNTER — Ambulatory Visit (INDEPENDENT_AMBULATORY_CARE_PROVIDER_SITE_OTHER): Payer: Medicaid Other | Admitting: Family Medicine

## 2021-01-28 VITALS — BP 112/64 | HR 104 | Wt 167.0 lb

## 2021-01-28 DIAGNOSIS — O99013 Anemia complicating pregnancy, third trimester: Secondary | ICD-10-CM

## 2021-01-28 DIAGNOSIS — Z3403 Encounter for supervision of normal first pregnancy, third trimester: Secondary | ICD-10-CM

## 2021-01-28 NOTE — Progress Notes (Signed)
  [facility] Prenatal Visit  Marchia Diguglielmo Rings is a 25 y.o. G2P1001 at [redacted]w[redacted]d here for routine follow up. She is dated by early ultrasound.  She reports no complaints.  She reports fetal movement. She denies vaginal bleeding, contractions, or loss of fluid.  See flow sheet for details.  Vitals:   01/28/21 1147  BP: 112/64  Pulse: (!) 104     A/P: Pregnancy at 101w3d.  Doing well.   Routine prenatal care:  Dating reviewed, dating tab is correct Fetal heart tones: Appropriate133 Fundal height: within expected range. 32 The patient does not have a history of HSV and valacyclovir is not indicated at this time.  The patient does not have a history of Cesarean delivery and no referral to [facility] is indicated Infant feeding choice: Both  Contraception choice: Undecided  Infant circumcision desired not applicable Influenza vaccine not administered as not influenza season.   Tdap was not given today. COVID vaccination was discussed and previously given.  Childbirth and education classes were not offered. Pregnancy education regarding benefits of breastfeeding, contraception, fetal growth, expected weight gain, and safe infant sleep were discussed.  Preterm labor and fetal movement precautions reviewed.   2. Pregnancy issues include the following and were addressed as appropriate today:  Anemia of pregnancy in the 3rd trimester. Patient started on iron supplementation at last visit, will recheck CBC at next visit.  Echogenic intracardiac focus. Low risk NIPS, negative carrier screening wanting no further work-up.  Currently on ASA 81mg  for decreasing risk of elevated BP and pre-eclampsia in pregnancy   Problem list and pregnancy box updated: Yes.    Follow up 2 weeks.

## 2021-01-28 NOTE — Patient Instructions (Addendum)
Make sure to show up a couple minutes early for your next appointment, we will be doing some lab work as well as some more discussions about planning for delivery and afterwards.  At this time everything looks good a baby's heart rate is good.    Go to the MAU at [facility] if: You begin to have strong, frequent contractions Your water breaks.  Sometimes it is a big gush of fluid, sometimes it is just a trickle that keeps getting your underwear wet or running down your legs You have vaginal bleeding.  It is normal to have a small amount of spotting if your cervix was checked.  You do not feel your baby moving like normal.  If you do not, get something to eat and drink and lay down and focus on feeling your baby move.   If your baby is still not moving like normal, you should go to MAU.

## 2021-02-11 ENCOUNTER — Encounter: Payer: Self-pay | Admitting: Family Medicine

## 2021-02-11 ENCOUNTER — Other Ambulatory Visit: Payer: Self-pay

## 2021-02-11 ENCOUNTER — Ambulatory Visit (INDEPENDENT_AMBULATORY_CARE_PROVIDER_SITE_OTHER): Payer: Medicaid Other | Admitting: Family Medicine

## 2021-02-11 VITALS — BP 110/64 | HR 78 | Wt 170.8 lb

## 2021-02-11 DIAGNOSIS — O99013 Anemia complicating pregnancy, third trimester: Secondary | ICD-10-CM

## 2021-02-11 DIAGNOSIS — Z3403 Encounter for supervision of normal first pregnancy, third trimester: Secondary | ICD-10-CM

## 2021-02-11 NOTE — Patient Instructions (Addendum)
Everything looks good today. We are going to check your blood to monitor your anemia. Please make sure to take your iron supplement.     Go to the MAU at [facility] at [facility] if: You begin to have strong, frequent contractions Your water breaks.  Sometimes it is a big gush of fluid, sometimes it is just a trickle that keeps getting your underwear wet or running down your legs You have vaginal bleeding.  It is normal to have a small amount of spotting if your cervix was checked.  You do not feel your baby moving like normal.  If you do not, get something to eat and drink and lay down and focus on feeling your baby move.   If your baby is still not moving like normal, you should go to MAU.   Preventing Preterm Birth Preterm birth is when a baby is delivered between 20 weeks and 37 weeks of pregnancy. A full-term pregnancy lasts for at least 37 complete weeks. Preterm birth can increase the risk of complications for your baby because the baby hasnot matured fully before being born. How can preterm birth affect my baby? Complications of preterm birth may include: Breathing problems. Problems with vision or hearing. Trouble with feeding. Infections or inflammation of the digestive tract (colitis). Low birth weight or very low birth weight. Brain damage that causes developmental delays and learning disabilities, and that affects movement and coordination (cerebral palsy). Higher risk for diabetes, heart disease, and high blood pressure later in life. What can increase my risk of having a preterm birth? Medical History The exact cause of preterm birth is unknown. The following factors make you more likely to have a preterm birth: Being diagnosed with placenta previa. This is a condition in which the placenta covers the lowest part of your uterus (cervix), which opens into the vagina. Certain conditions of your current and past pregnancies, such as: Having had a preterm birth  before. Being pregnant with multiples. Having less than 18 months between pregnancies. Certain abnormalities in your unborn baby. Vaginal bleeding during pregnancy. Becoming pregnant through in vitro fertilization (IVF). Being overweight or underweight. Medical history of: STIs (sexually transmitted infections) or other infections of the urinary tract and the vagina. Long-term (chronic) illnesses, such as blood clotting problems, diabetes, or high blood pressure. Short cervix. Lifestyle and environmental factors Using tobacco products or drugs. Drinking alcohol. Having stress and no social support. Violence in the home (domestic violence). Being exposed to certain chemicals or pollutants in the environment. What actions can I take to prevent preterm birth?  Medical care The most important thing you can do to lower your risk for preterm birth is to get routine medical care during pregnancy (prenatal care). Keep all follow-up visits. This is important. If you have a high risk of preterm birth: You may be referred to a health care provider who specializes in managing high-risk pregnancies (perinatologist). You may be given medicine to help prevent preterm birth. Lifestyle Certain lifestyle changes can also lower your risk of preterm birth: Wait at least 6 months after a pregnancy to become pregnant again. Get to a healthy weight before getting pregnant. If you are overweight, work with your health care provider to safely lose weight. Do not use any products that contain nicotine or tobacco. These products include cigarettes, chewing tobacco, and vaping devices, such as e-cigarettes. If you need help quitting, ask your health care provider. Do not drink alcohol. Do not use drugs. Eat  a healthy diet. Manage other medical problems, such as diabetes or high blood pressure. Where to find support For more support, consider: Talking with your health care provider. Talking with a therapist  or substance abuse counselor, if you need help quitting. Working with a Data processing manager or a Systems analyst to maintain a healthy weight. Joining a support group. Where to find more information Learn more about preventing preterm birth from: Centers for Disease Control and Prevention: TonerPromos.no March of Dimes: marchofdimes.org American Pregnancy Association: americanpregnancy.org Contact a health care provider if: You have any of the following symptoms of preterm labor before 37 weeks: A change or increase in vaginal discharge. Fluid leaking from your vagina. Pressure or cramps in your lower abdomen. A backache that does not go away or gets worse. Regular tightening (contractions) in your lower abdomen. Get help right away if: You are having regular painful contractions every 5 minutes or less. Your water breaks. Summary Preterm birth means having your baby during weeks 20-37 of pregnancy. Preterm birth may put your baby at risk for health complications. The exact cause of preterm birth is unknown. Getting routine prenatal care can help prevent preterm birth. Keep all follow-up visits. This is important. Contact a health care provider if you have symptoms of preterm labor. This information is not intended to replace advice given to you by your health care provider. Make sure you discuss any questions you have with your healthcare provider. Document Revised: 07/16/2020 Document Reviewed: 07/16/2020 Elsevier Patient Education  2022 [employer].

## 2021-02-11 NOTE — Progress Notes (Signed)
  [facility] Prenatal Visit  Sara Kelly is a 25 y.o. G2P1001 at [redacted]w[redacted]d here for routine follow up. She is dated by early ultrasound.  She reports no concerns at this time, has her baseline white clear discharge.  She reports fetal movement. She denies vaginal bleeding, contractions, or loss of fluid.  See flow sheet for details.  Vitals:   02/11/21 1024  BP: 110/64  Pulse: 78     A/P: Pregnancy at [redacted]w[redacted]d.  Doing well.   Routine prenatal care:  Dating reviewed, dating tab is correct Fetal heart tones: Appropriate 145bpm Fundal height: within expected range.  33cm The patient does not have a history of HSV and valacyclovir is not indicated at this time.  The patient does not have a history of Cesarean delivery and no referral to [facility] is indicated Infant feeding choice: Both  Contraception choice: Undecided  Infant circumcision desired not applicable Influenza vaccine not administered as not influenza season.   Tdap previously administered on 01/16/2021 COVID vaccination was discussed and previously completed.  Childbirth and education classes were not offered. Pregnancy education regarding benefits of breastfeeding, contraception, fetal growth, expected weight gain, and safe infant sleep were discussed.  Preterm labor and fetal movement precautions reviewed.   2. Pregnancy issues include the following and were addressed as appropriate today:  GC/Chlamydia and GBS at next visit.  Anemia of pregnancy. Patient has not been taking her iron supplementation. Will recheck CBC today and patient to resume iron.   Echogenic intracardiac focus. Low risk NIPS, negative carrier screen.  On ASA 81mg  for decreasing risk of elevated BP and pre-eclampsia.  Problem list and pregnancy box updated: Yes.    Follow up 2 weeks on 02/28/2021.

## 2021-02-12 LAB — CBC
Hematocrit: 28.7 % — ABNORMAL LOW (ref 34.0–46.6)
Hemoglobin: 9.5 g/dL — ABNORMAL LOW (ref 11.1–15.9)
MCH: 32.8 pg (ref 26.6–33.0)
MCHC: 33.1 g/dL (ref 31.5–35.7)
MCV: 99 fL — ABNORMAL HIGH (ref 79–97)
Platelets: 176 10*3/uL (ref 150–450)
RBC: 2.9 x10E6/uL — ABNORMAL LOW (ref 3.77–5.28)
RDW: 11.5 % — ABNORMAL LOW (ref 11.7–15.4)
WBC: 8.7 10*3/uL (ref 3.4–10.8)

## 2021-02-28 ENCOUNTER — Other Ambulatory Visit: Payer: Self-pay

## 2021-02-28 ENCOUNTER — Ambulatory Visit (INDEPENDENT_AMBULATORY_CARE_PROVIDER_SITE_OTHER): Payer: Medicaid Other | Admitting: Family Medicine

## 2021-02-28 ENCOUNTER — Other Ambulatory Visit (HOSPITAL_COMMUNITY)
Admission: RE | Admit: 2021-02-28 | Discharge: 2021-02-28 | Disposition: A | Discharge status: Home / Self Care | Payer: Medicaid Other | Source: Ambulatory Visit | Attending: Family Medicine | Admitting: Family Medicine

## 2021-02-28 VITALS — BP 106/70 | HR 98 | Wt 176.0 lb

## 2021-02-28 DIAGNOSIS — Z3403 Encounter for supervision of normal first pregnancy, third trimester: Secondary | ICD-10-CM | POA: Diagnosis not present

## 2021-02-28 NOTE — Patient Instructions (Signed)
Signs and Symptoms of Labor Labor is the body's natural process of moving the baby and the placenta out of the uterus. The process of labor usually starts when the baby is full-term,between 37 and 40 weeks of pregnancy. Signs and symptoms that you are close to going into labor As your body prepares for labor and the birth of your baby, you may notice the following symptoms in the weeks and days before true labor starts: Passing a small amount of thick, bloody mucus from your vagina. This is called normal bloody show or losing your mucus plug. This may happen more than a week before labor begins, or right before labor begins, as the opening of the cervix starts to widen (dilate). For some women, the entire mucus plug passes at once. For others, pieces of the mucus plug may gradually pass over several days. Your baby moving (dropping) lower in your pelvis to get into position for birth (lightening). When this happens, you may feel more pressure on your bladder and pelvic bone and less pressure on your ribs. This may make it easier to breathe. It may also cause you to need to urinate more often and have problems with bowel movements. Having "practice contractions," also called Braxton Hicks contractions or false labor. These occur at irregular (unevenly spaced) intervals that are more than 10 minutes apart. False labor contractions are common after exercise or sexual activity. They will stop if you change position, rest, or drink fluids. These contractions are usually mild and do not get stronger over time. They may feel like: A backache or back pain. Mild cramps, similar to menstrual cramps. Tightening or pressure in your abdomen. Other early symptoms include: Nausea or loss of appetite. Diarrhea. Having a sudden burst of energy, or feeling very tired. Mood changes. Having trouble sleeping. Signs and symptoms that labor has begun Signs that you are in labor may include: Having contractions that come  at regular (evenly spaced) intervals and increase in intensity. This may feel like more intense tightening or pressure in your abdomen that moves to your back. Contractions may also feel like rhythmic pain in your upper thighs or back that comes and goes at regular intervals. For first-time mothers, this change in intensity of contractions often occurs at a more gradual pace. Women who have given birth before may notice a more rapid progression of contraction changes. Feeling pressure in the vaginal area. Your water breaking (rupture of membranes). This is when the sac of fluid that surrounds your baby breaks. Fluid leaking from your vagina may be clear or blood-tinged. Labor usually starts within 24 hours of your water breaking, but it may take longer to begin. Some women may feel a sudden gush of fluid. Others notice that their underwear repeatedly becomes damp. Follow these instructions at home:  When labor starts, or if your water breaks, call your health care provider or nurse care line. Based on your situation, they will determine when you should go in for an exam. During early labor, you may be able to rest and manage symptoms at home. Some strategies to try at home include: Breathing and relaxation techniques. Taking a warm bath or shower. Listening to music. Using a heating pad on the lower back for pain. If you are directed to use heat: Place a towel between your skin and the heat source. Leave the heat on for 20-30 minutes. Remove the heat if your skin turns bright red. This is especially important if you are unable to feel   pain, heat, or cold. You may have a greater risk of getting burned. Contact a health care provider if: Your labor has started. Your water breaks. Get help right away if: You have painful, regular contractions that are 5 minutes apart or less. Labor starts before you are [redacted] weeks along in your pregnancy. You have a fever. You have bright red blood coming from  your vagina. You do not feel your baby moving. You have a severe headache with or without vision problems. You have severe nausea, vomiting, or diarrhea. You have chest pain or shortness of breath. These symptoms may represent a serious problem that is an emergency. Do not wait to see if the symptoms will go away. Get medical help right away. Call your local emergency services (911 in the [place] Do not drive yourself to the hospital. Summary Labor is your body's natural process of moving your baby and the placenta out of your uterus. The process of labor usually starts when your baby is full-term, between 50 and 40 weeks of pregnancy. When labor starts, or if your water breaks, call your health care provider or nurse care line. Based on your situation, they will determine when you should go in for an exam. This information is not intended to replace advice given to you by your health care provider. Make sure you discuss any questions you have with your healthcare provider. Document Revised: 05/04/2020 Document Reviewed: 05/04/2020 Elsevier Patient Education  2022 Elsevier Inc.    Go to the MAU at [facility] at [facility] if: You begin to have strong, frequent contractions Your water breaks.  Sometimes it is a big gush of fluid, sometimes it is just a trickle that keeps getting your underwear wet or running down your legs You have vaginal bleeding.  It is normal to have a small amount of spotting if your cervix was checked.  You do not feel your baby moving like normal.  If you do not, get something to eat and drink and lay down and focus on feeling your baby move.   If your baby is still not moving like normal, you should go to MAU.

## 2021-02-28 NOTE — Progress Notes (Signed)
  [facility] Prenatal Visit  Sara Kelly is a 25 y.o. G2P1001 at [redacted]w[redacted]d here for routine follow up. She is dated by early ultrasound.  She reports  intermittent contractions that last 3-5 minutes . She reports fetal movement. She denies vaginal bleeding, contractions, or loss of fluid. See flow sheet for details.  Vitals:   02/28/21 1411  BP: 106/70  Pulse: 98  FHT 138  A/P: Pregnancy at [redacted]w[redacted]d.  Doing well.   Routine prenatal care  Dating reviewed, dating tab is correct Fetal heart tones Appropriate Fundal height within expected range.  GBS collected today. .  Repeat GC/CT collected today.  The patient does not have a history of HSV and valacyclovir is not indicated at this time.  Infant feeding choice: Both  Contraception choice: Undecided  Infant circumcision desired not applicable COVID vaccination previously given. Pregnancy education regarding preterm labor, fetal movement,  benefits of breastfeeding, contraception, fetal growth, expected weight gain, and safe infant sleep were discussed.    2. Pregnancy issues include the following and were addressed as appropriate today:   GC/committee and GBS were obtained at this visit  History of anemia during this pregnancy, still encouraged taking iron supplementation.  Echogenic intracardiac focus with low risk nips and negative carrier screen  Problem list and pregnancy box updated: Yes.  Follow up 1 week.

## 2021-03-03 LAB — CERVICOVAGINAL ANCILLARY ONLY
Chlamydia: NEGATIVE
Comment: NEGATIVE
Comment: NORMAL
Neisseria Gonorrhea: NEGATIVE

## 2021-03-04 LAB — CULTURE, BETA STREP (GROUP B ONLY): Strep Gp B Culture: NEGATIVE

## 2021-03-07 ENCOUNTER — Other Ambulatory Visit: Payer: Self-pay

## 2021-03-07 ENCOUNTER — Ambulatory Visit (INDEPENDENT_AMBULATORY_CARE_PROVIDER_SITE_OTHER): Payer: Medicaid Other | Admitting: Family Medicine

## 2021-03-07 DIAGNOSIS — Z3403 Encounter for supervision of normal first pregnancy, third trimester: Secondary | ICD-10-CM

## 2021-03-07 NOTE — Progress Notes (Signed)
  [facility] Prenatal Visit  Sara Kelly is a 24 y.o. G2P1001 at [redacted]w[redacted]d here for routine follow up. She is dated by early ultrasound.  She reports no complaints. She reports fetal movement. She denies vaginal bleeding, contractions, or loss of fluid. See flow sheet for details.  Vitals:   03/07/21 1110  BP: 119/71  Pulse: 92    A/P: Pregnancy at [redacted]w[redacted]d.  Doing well.   Routine prenatal care  Dating reviewed, dating tab is correct Fetal heart tones Appropriate Fundal height within expected range.  Fetal position confirmed Vertex using Ultrasound .  GBS collected previously on 8/5 and was negative Repeat GC/CT completed on 8/5 and was negative The patient does not have a history of HSV and valacyclovir is not indicated at this time.  Infant feeding choice: Both  Tdap previously administered between 27-36 weeks  COVID vaccination was discussed and previously administered.  Pregnancy education regarding preterm labor, fetal movement,  benefits of breastfeeding, contraception, fetal growth, expected weight gain, and safe infant sleep were discussed.    2. Pregnancy issues include the following and were addressed as appropriate today:   Anemia of pregnancy, reports that she is still not taking the iron pills. Counseled patient to restart them.  Patient's induction was scheduled for 9/10 (which will be at 41 weeks).  Echogenic intracardiac focus with low risk nips and negative carrier screen  Problem list and pregnancy box updated: Yes.  Follow up 1 week.

## 2021-03-07 NOTE — Patient Instructions (Signed)
Everything looks great right now. You have been scheduled for an induction on 04/05/2021 at midnight, he will be 41 weeks at this time and you will need to go to this appointment if you have not delivered by then.   Go to the MAU at [facility] at [facility] if: You begin to have strong, frequent contractions Your water breaks.  Sometimes it is a big gush of fluid, sometimes it is just a trickle that keeps getting your underwear wet or running down your legs You have vaginal bleeding.  It is normal to have a small amount of spotting if your cervix was checked.  You do not feel your baby moving like normal.  If you do not, get something to eat and drink and lay down and focus on feeling your baby move.   If your baby is still not moving like normal, you should go to MAU.   Signs and Symptoms of Labor Labor is the body's natural process of moving the baby and the placenta out of the uterus. The process of labor usually starts when the baby is full-term,between 37 and 40 weeks of pregnancy. Signs and symptoms that you are close to going into labor As your body prepares for labor and the birth of your baby, you may notice the following symptoms in the weeks and days before true labor starts: Passing a small amount of thick, bloody mucus from your vagina. This is called normal bloody show or losing your mucus plug. This may happen more than a week before labor begins, or right before labor begins, as the opening of the cervix starts to widen (dilate). For some women, the entire mucus plug passes at once. For others, pieces of the mucus plug may gradually pass over several days. Your baby moving (dropping) lower in your pelvis to get into position for birth (lightening). When this happens, you may feel more pressure on your bladder and pelvic bone and less pressure on your ribs. This may make it easier to breathe. It may also cause you to need to urinate more often and have problems with  bowel movements. Having "practice contractions," also called Braxton Hicks contractions or false labor. These occur at irregular (unevenly spaced) intervals that are more than 10 minutes apart. False labor contractions are common after exercise or sexual activity. They will stop if you change position, rest, or drink fluids. These contractions are usually mild and do not get stronger over time. They may feel like: A backache or back pain. Mild cramps, similar to menstrual cramps. Tightening or pressure in your abdomen. Other early symptoms include: Nausea or loss of appetite. Diarrhea. Having a sudden burst of energy, or feeling very tired. Mood changes. Having trouble sleeping. Signs and symptoms that labor has begun Signs that you are in labor may include: Having contractions that come at regular (evenly spaced) intervals and increase in intensity. This may feel like more intense tightening or pressure in your abdomen that moves to your back. Contractions may also feel like rhythmic pain in your upper thighs or back that comes and goes at regular intervals. For first-time mothers, this change in intensity of contractions often occurs at a more gradual pace. Women who have given birth before may notice a more rapid progression of contraction changes. Feeling pressure in the vaginal area. Your water breaking (rupture of membranes). This is when the sac of fluid that surrounds your baby breaks. Fluid leaking from your vagina may be clear or  blood-tinged. Labor usually starts within 24 hours of your water breaking, but it may take longer to begin. Some women may feel a sudden gush of fluid. Others notice that their underwear repeatedly becomes damp. Follow these instructions at home:  When labor starts, or if your water breaks, call your health care provider or nurse care line. Based on your situation, they will determine when you should go in for an exam. During early labor, you may be able to  rest and manage symptoms at home. Some strategies to try at home include: Breathing and relaxation techniques. Taking a warm bath or shower. Listening to music. Using a heating pad on the lower back for pain. If you are directed to use heat: Place a towel between your skin and the heat source. Leave the heat on for 20-30 minutes. Remove the heat if your skin turns bright red. This is especially important if you are unable to feel pain, heat, or cold. You may have a greater risk of getting burned. Contact a health care provider if: Your labor has started. Your water breaks. Get help right away if: You have painful, regular contractions that are 5 minutes apart or less. Labor starts before you are [redacted] weeks along in your pregnancy. You have a fever. You have bright red blood coming from your vagina. You do not feel your baby moving. You have a severe headache with or without vision problems. You have severe nausea, vomiting, or diarrhea. You have chest pain or shortness of breath. These symptoms may represent a serious problem that is an emergency. Do not wait to see if the symptoms will go away. Get medical help right away. Call your local emergency services (911 in the [place] Do not drive yourself to the hospital. Summary Labor is your body's natural process of moving your baby and the placenta out of your uterus. The process of labor usually starts when your baby is full-term, between 26 and 40 weeks of pregnancy. When labor starts, or if your water breaks, call your health care provider or nurse care line. Based on your situation, they will determine when you should go in for an exam. This information is not intended to replace advice given to you by your health care provider. Make sure you discuss any questions you have with your healthcare provider. Document Revised: 05/04/2020 Document Reviewed: 05/04/2020 Elsevier Patient Education  2022 [employer].

## 2021-03-18 ENCOUNTER — Other Ambulatory Visit: Payer: Self-pay

## 2021-03-18 ENCOUNTER — Inpatient Hospital Stay (HOSPITAL_COMMUNITY)
Admission: AD | Admit: 2021-03-18 | Discharge: 2021-03-19 | Disposition: A | Discharge status: Home / Self Care | Payer: Medicaid Other | Attending: Obstetrics and Gynecology | Admitting: Obstetrics and Gynecology

## 2021-03-18 DIAGNOSIS — Z3A38 38 weeks gestation of pregnancy: Secondary | ICD-10-CM | POA: Insufficient documentation

## 2021-03-18 DIAGNOSIS — O471 False labor at or after 37 completed weeks of gestation: Secondary | ICD-10-CM | POA: Insufficient documentation

## 2021-03-18 DIAGNOSIS — O26853 Spotting complicating pregnancy, third trimester: Secondary | ICD-10-CM | POA: Insufficient documentation

## 2021-03-18 DIAGNOSIS — N898 Other specified noninflammatory disorders of vagina: Secondary | ICD-10-CM

## 2021-03-18 DIAGNOSIS — O479 False labor, unspecified: Secondary | ICD-10-CM

## 2021-03-18 DIAGNOSIS — Z87891 Personal history of nicotine dependence: Secondary | ICD-10-CM | POA: Insufficient documentation

## 2021-03-18 DIAGNOSIS — O26893 Other specified pregnancy related conditions, third trimester: Secondary | ICD-10-CM

## 2021-03-18 DIAGNOSIS — Z79899 Other long term (current) drug therapy: Secondary | ICD-10-CM | POA: Insufficient documentation

## 2021-03-18 NOTE — MAU Note (Addendum)
PT SAYS 940PM-BROWN D/C ON UNDERWEAR  THEN WIPED - BROWNISH.  LAST SEX- YESTERDAY  PNC WITH FP NO VE- HAS AN APPOINTMENT TOMORROW  GBS- NEG FEELS SOME CRAMPS

## 2021-03-19 ENCOUNTER — Encounter (HOSPITAL_COMMUNITY): Payer: Self-pay | Admitting: Obstetrics and Gynecology

## 2021-03-19 ENCOUNTER — Other Ambulatory Visit: Payer: Self-pay

## 2021-03-19 ENCOUNTER — Ambulatory Visit (INDEPENDENT_AMBULATORY_CARE_PROVIDER_SITE_OTHER): Payer: Medicaid Other | Admitting: Family Medicine

## 2021-03-19 VITALS — BP 110/82 | HR 98 | Wt 181.4 lb

## 2021-03-19 DIAGNOSIS — Z3A38 38 weeks gestation of pregnancy: Secondary | ICD-10-CM | POA: Diagnosis not present

## 2021-03-19 DIAGNOSIS — O26853 Spotting complicating pregnancy, third trimester: Secondary | ICD-10-CM | POA: Diagnosis present

## 2021-03-19 DIAGNOSIS — N898 Other specified noninflammatory disorders of vagina: Secondary | ICD-10-CM

## 2021-03-19 DIAGNOSIS — O26893 Other specified pregnancy related conditions, third trimester: Secondary | ICD-10-CM | POA: Diagnosis not present

## 2021-03-19 DIAGNOSIS — Z87891 Personal history of nicotine dependence: Secondary | ICD-10-CM | POA: Diagnosis not present

## 2021-03-19 DIAGNOSIS — O471 False labor at or after 37 completed weeks of gestation: Secondary | ICD-10-CM | POA: Diagnosis not present

## 2021-03-19 DIAGNOSIS — Z3403 Encounter for supervision of normal first pregnancy, third trimester: Secondary | ICD-10-CM

## 2021-03-19 DIAGNOSIS — Z79899 Other long term (current) drug therapy: Secondary | ICD-10-CM | POA: Diagnosis not present

## 2021-03-19 NOTE — MAU Provider Note (Signed)
Chief Complaint:  Vaginal Discharge   Event Date/Time   First Provider Initiated Contact with Patient 03/19/21 0017     HPI: Sara Kelly is a 25 y.o. G2P1001 at 34w4dwho presents to maternity admissions reporting brown discharge at home.  Had intercourse yesterday.  Having mild painless contractions. . She reports good fetal movement, denies LOF, urinary symptoms, h/a, dizziness, n/v, diarrhea, constipation or fever/chills.    Vaginal Discharge The patient's primary symptoms include vaginal bleeding (brown old blood) and vaginal discharge. The patient's pertinent negatives include no genital itching, genital lesions, genital odor or pelvic pain. This is a new problem. The current episode started today. The patient is experiencing no pain. She is pregnant. Pertinent negatives include no abdominal pain, back pain, chills, dysuria or fever. The vaginal discharge was brown. The vaginal bleeding is spotting. She has not been passing clots. She has not been passing tissue. Nothing aggravates the symptoms. She has tried nothing for the symptoms.   RN Note: PT SAYS 940PM-BROWN D/C ON UNDERWEAR  THEN WIPED - BROWNISH.  LAST SEX- YESTERDAY  PNC WITH FP NO VE- HAS AN APPOINTMENT TOMORROW  GBS- NEG FEELS SOME CRAMPS   Past Medical History: Past Medical History:  Diagnosis Date   Boil of buttock    Normal labor 03/14/2017    Past obstetric history: OB History  Gravida Para Term Preterm AB Living  2 1 1     1   SAB IAB Ectopic Multiple Live Births        0 1    # Outcome Date GA Lbr Len/2nd Weight Sex Delivery Anes PTL Lv  2 Current           1 Term 03/14/17 [redacted]w[redacted]d 05:37 / 00:15 3425 g F Vag-Spont EPI  LIV    Past Surgical History: Past Surgical History:  Procedure Laterality Date   NO PAST SURGERIES      Family History: Family History  Problem Relation Age of Onset   Healthy Mother    Healthy Father    Diabetes Sister     Social History: Social History   Tobacco Use    Smoking status: Former    Types: Cigars    Quit date: 07/27/2016    Years since quitting: 4.6   Smokeless tobacco: Never  Substance Use Topics   Alcohol use: No    Comment: quit with +preg   Drug use: Yes    Types: Marijuana    Comment: Stopped once pregnant    Allergies:  Allergies  Allergen Reactions   Amoxicillin Hives    Has patient had a PCN reaction causing immediate rash, facial/tongue/throat swelling, SOB or lightheadedness with hypotension: No Has patient had a PCN reaction causing severe rash involving mucus membranes or skin necrosis: No Has patient had a PCN reaction that required hospitalization: No Has patient had a PCN reaction occurring within the last 10 years: Yes If all of the above answers are "NO", then may proceed with Cephalosporin use.    Penicillins Hives    Has patient had a PCN reaction causing immediate rash, facial/tongue/throat swelling, SOB or lightheadedness with hypotension: No Has patient had a PCN reaction causing severe rash involving mucus membranes or skin necrosis: No Has patient had a PCN reaction that required hospitalization: No Has patient had a PCN reaction occurring within the last 10 years: Yes If all of the above answers are "NO", then may proceed with Cephalosporin use.    Meds:  Medications Prior to Admission  Medication Sig Dispense Refill Last Dose   acetaminophen (TYLENOL) 500 MG tablet Take 2 tablets (1,000 mg total) by mouth every 6 (six) hours as needed for moderate pain. 30 tablet 0    aspirin EC 81 MG tablet Take 1 tablet (81 mg total) by mouth daily. Swallow whole. 30 tablet 3    ferrous sulfate 325 (65 FE) MG EC tablet Take 1 tablet (325 mg total) by mouth every other day. 30 tablet 1    Prenatal Vit-Fe Fumarate-FA (PRENATAL MULTIVITAMIN) TABS tablet Take 1 tablet by mouth daily.       I have reviewed patient's Past Medical Hx, Surgical Hx, Family Hx, Social Hx, medications and allergies.   ROS:  Review of Systems   Constitutional:  Negative for chills and fever.  Gastrointestinal:  Negative for abdominal pain.  Genitourinary:  Positive for vaginal discharge. Negative for dysuria and pelvic pain.  Musculoskeletal:  Negative for back pain.  Other systems negative  Physical Exam  Patient Vitals for the past 24 hrs:  BP Temp Temp src Pulse Resp Height Weight  03/19/21 0003 113/68 98.1 F (36.7 C) Oral 88 18 5\' 4"  (1.626 m) 82.4 kg   Constitutional: Well-developed, well-nourished female in no acute distress.  Cardiovascular: normal rate and rhythm Respiratory: normal effort, clear to auscultation bilaterally GI: Abd soft, non-tender, gravid appropriate for gestational age.   No rebound or guarding. MS: Extremities nontender, no edema, normal ROM Neurologic: Alert and oriented x 4.  GU: Neg CVAT.  PELVIC EXAM: Dilation: 1.5 Effacement (%): 50 Cervical Position: Middle Station: -3 Presentation: Vertex Exam by:: Briana Farner CNM Small amount brown discharge on glove  FHT:  Baseline 140 , moderate variability, accelerations present, no decelerations Contractions:n   Irregular     Labs: No results found for this or any previous visit (from the past 24 hour(s)). O/Positive/-- (12/28 1656)  Imaging:  No results found.  MAU Course/MDM: NST reviewed, reactive.  Treatments in MAU included EFM.    Assessment: Single IUP at [redacted]w[redacted]d Brown vaginal discharge Braxton Hicks contractions  Plan: Discharge home Labor precautions and fetal kick counts Keep appt tomorrow Follow up in Office for prenatal visits  Encouraged to return if she develops worsening of symptoms, increase in pain, fever, or other concerning symptoms.  Pt stable at time of discharge.  [redacted]w[redacted]d CNM, MSN Certified Nurse-Midwife 03/19/2021 12:17 AM

## 2021-03-19 NOTE — Progress Notes (Signed)
  [facility] Prenatal Visit  Sara Kelly is a 25 y.o. G2P1001 at [redacted]w[redacted]d here for routine follow up. She is dated by early ultrasound.  She reports no bleeding, no cramping, occasional contractions, and loss of mucous plug . She reports fetal movement. She denies vaginal bleeding, contractions, or loss of fluid. See flow sheet for details.  Patient reports she noticed mucus in her underwear yesterday and went to the maternal assessment unit and was 1 cm dilated.  She reports this morning she had a mucous plug in her underwear.  She would like a cervical exam today.  Vitals:   03/19/21 1005  BP: 110/82  Pulse: 98    A/P: Pregnancy at [redacted]w[redacted]d.  Doing well.   Routine prenatal care:  Dating reviewed, dating tab is correct Fetal heart tones Appropriate 143 Fundal height within expected range. 39 cm Fetal position confirmed Vertex using Ultrasound .  Infant feeding choice: Breastfeeding Contraception choice: Undecided  IUD vs patch  Infant circumcision desired not applicable Pain control in labor discussed and patient desires natural birth but may consider pain management .  Influenza vaccine not administered as not influenza season.   Tdap previously administered between 27-36 weeks  GBS and gc/chlamydia testing results were reviewed today.   Pregnancy education regarding labor, fetal movement,  benefits of breastfeeding, contraception, and safe infant sleep were discussed.  Labor and fetal movement precautions reviewed. Induction of labor discussed. Scheduled for induction at approximately 41 weeks.  Patient does not have BPP scheduled and it will need to be scheduled at next visit  2. Pregnancy issues include the following and were addressed as appropriate today:   Anemia of pregnancy, is not taking iron pills. - Echogenic intracardiac focus with a low risk nips and negative carrier screen - GBS and repeat gonorrhea/chlamydia collected last visit which all came back  negative - Patient has been scheduled for induction on 9/10.  I do not think she will make it to this but she will need BPP scheduled at next visit  Problem list and pregnancy box updated: Yes.   Follow up 1 week.

## 2021-03-19 NOTE — Patient Instructions (Signed)
It was great seeing you today!  You are almost to delivery time.  If you have any vaginal bleeding, gush of fluid, contractions, decreased fetal movement please go be assessed at the maternal assessment unit.  If you have any questions or concerns call the clinic.

## 2021-03-20 ENCOUNTER — Ambulatory Visit: Payer: Medicaid Other

## 2021-03-24 ENCOUNTER — Encounter: Payer: Medicaid Other | Admitting: Family Medicine

## 2021-03-27 ENCOUNTER — Encounter: Payer: Self-pay | Admitting: Family Medicine

## 2021-03-27 NOTE — Progress Notes (Signed)
  [facility] Prenatal Visit  Sara Kelly is a 25 y.o. G2P1001 at [redacted]w[redacted]d here for routine follow up. She is dated by LMP.  She reports occasional contractions and increased mucous discharge . She reports fetal movement. She denies vaginal bleeding, contractions, or loss of fluid.  See flow sheet for details.  She reports having lost her mucous plug and is having an increase in thin mucus discharge.  She wonders if she has broken her water.  Vitals:   03/28/21 1112  BP: 116/78  Pulse: 88   A/P: Pregnancy at [redacted]w[redacted]d.  Doing well.   Routine prenatal care:  Dating reviewed, dating tab is correct Fetal position confirmed Vertex.  Infant feeding choice: Breastfeeding Contraception choice: Depo-Provera Infant circumcision desired not applicable Influenza vaccine not administered, as not yet flu season and flu vaccine not available in clinic yet.  Tdap administered 01/16/21 Biophysical profile is scheduled between 40w0 day and [redacted]w[redacted]d.  Induction scheduled for 41 weeks on 9/10 with Dr. Despina Hidden.  Labor and fetal movement precautions reviewed.  2. Pregnancy issues include the following and were addressed as appropriate today:   Sterile speculum exam performed, exam not consistent with amniotic rupture.  Ferning test negative.  Problem list and pregnancy box updated: Yes Scheduled for [facility] 9/7 Plan for induction scheduled 9/10 with Dr. Despina Hidden  Scheduled for 6 week postpartum visit on 10/19 at 10:10am with Dr. Clayborne Artist.   Fayette Pho, MD

## 2021-03-27 NOTE — Patient Instructions (Signed)
It was wonderful to meet you today. Thank you for allowing me to be a part of your care. Below is a short summary of what we discussed at your visit today:  Unfortunately your water has not yet broken  Please go to your BPP scan o 9/7. Refer to the appointment card for time.   Your induction is scheduled for 9/10.   Emergency Planning If you experience any vaginal bleeding, leakage of fluids, don't feel your baby moving as much, or start to have contractions less than 5 minutes apart please go directly to the Maternal Assessment Unit at [facility] for evaluation.  Maternity and women's care services located on the [place] side of [facility] [place]. [facility] (Entrance [address] off [address]  [address] [place],  [place]  [address]      If you have any questions or concerns, please do not hesitate to contact us via phone or MyChart message.   Fayette Pho, MD

## 2021-03-28 ENCOUNTER — Other Ambulatory Visit: Payer: Self-pay

## 2021-03-28 ENCOUNTER — Ambulatory Visit (INDEPENDENT_AMBULATORY_CARE_PROVIDER_SITE_OTHER): Payer: Medicaid Other | Admitting: Family Medicine

## 2021-03-28 ENCOUNTER — Other Ambulatory Visit: Payer: Self-pay | Admitting: Family Medicine

## 2021-03-28 VITALS — BP 116/78 | HR 88 | Wt 179.0 lb

## 2021-03-28 DIAGNOSIS — Z3403 Encounter for supervision of normal first pregnancy, third trimester: Secondary | ICD-10-CM | POA: Diagnosis not present

## 2021-03-28 DIAGNOSIS — O480 Post-term pregnancy: Secondary | ICD-10-CM

## 2021-03-29 ENCOUNTER — Other Ambulatory Visit: Payer: Self-pay | Admitting: Women's Health

## 2021-03-31 ENCOUNTER — Other Ambulatory Visit: Payer: Self-pay | Admitting: Advanced Practice Midwife

## 2021-04-01 ENCOUNTER — Telehealth: Payer: Self-pay

## 2021-04-01 ENCOUNTER — Inpatient Hospital Stay (HOSPITAL_COMMUNITY)
Admission: AD | Admit: 2021-04-01 | Discharge: 2021-04-03 | DRG: 807 | Disposition: A | Discharge status: Home / Self Care | Payer: Medicaid Other | Attending: Obstetrics & Gynecology | Admitting: Obstetrics & Gynecology

## 2021-04-01 ENCOUNTER — Inpatient Hospital Stay (HOSPITAL_COMMUNITY): Payer: Medicaid Other | Admitting: Anesthesiology

## 2021-04-01 ENCOUNTER — Other Ambulatory Visit: Payer: Self-pay

## 2021-04-01 ENCOUNTER — Encounter (HOSPITAL_COMMUNITY): Payer: Self-pay | Admitting: Obstetrics and Gynecology

## 2021-04-01 ENCOUNTER — Telehealth (HOSPITAL_COMMUNITY): Payer: Self-pay | Admitting: *Deleted

## 2021-04-01 DIAGNOSIS — Z20822 Contact with and (suspected) exposure to covid-19: Secondary | ICD-10-CM | POA: Diagnosis not present

## 2021-04-01 DIAGNOSIS — O99214 Obesity complicating childbirth: Secondary | ICD-10-CM | POA: Diagnosis present

## 2021-04-01 DIAGNOSIS — Z880 Allergy status to penicillin: Secondary | ICD-10-CM | POA: Diagnosis not present

## 2021-04-01 DIAGNOSIS — Z3A40 40 weeks gestation of pregnancy: Secondary | ICD-10-CM | POA: Diagnosis not present

## 2021-04-01 DIAGNOSIS — O9902 Anemia complicating childbirth: Secondary | ICD-10-CM | POA: Diagnosis not present

## 2021-04-01 DIAGNOSIS — Z87891 Personal history of nicotine dependence: Secondary | ICD-10-CM

## 2021-04-01 DIAGNOSIS — Z7982 Long term (current) use of aspirin: Secondary | ICD-10-CM

## 2021-04-01 DIAGNOSIS — O26893 Other specified pregnancy related conditions, third trimester: Secondary | ICD-10-CM | POA: Diagnosis not present

## 2021-04-01 DIAGNOSIS — O480 Post-term pregnancy: Principal | ICD-10-CM | POA: Diagnosis present

## 2021-04-01 DIAGNOSIS — D649 Anemia, unspecified: Secondary | ICD-10-CM | POA: Diagnosis not present

## 2021-04-01 LAB — TYPE AND SCREEN
ABO/RH(D): O POS
Antibody Screen: NEGATIVE

## 2021-04-01 LAB — CBC
HCT: 29.8 % — ABNORMAL LOW (ref 36.0–46.0)
Hemoglobin: 9.2 g/dL — ABNORMAL LOW (ref 12.0–15.0)
MCH: 30.6 pg (ref 26.0–34.0)
MCHC: 30.9 g/dL (ref 30.0–36.0)
MCV: 99 fL (ref 80.0–100.0)
Platelets: 188 10*3/uL (ref 150–400)
RBC: 3.01 MIL/uL — ABNORMAL LOW (ref 3.87–5.11)
RDW: 14.2 % (ref 11.5–15.5)
WBC: 6.8 10*3/uL (ref 4.0–10.5)
nRBC: 0 % (ref 0.0–0.2)

## 2021-04-01 LAB — RESP PANEL BY RT-PCR (FLU A&B, COVID) ARPGX2
Influenza A by PCR: NEGATIVE
Influenza B by PCR: NEGATIVE
SARS Coronavirus 2 by RT PCR: NEGATIVE

## 2021-04-01 MED ORDER — EPHEDRINE 5 MG/ML INJ: 10.0000 mg | INTRAVENOUS | Status: DC | PRN

## 2021-04-01 MED ORDER — LIDOCAINE HCL (PF) 1 % IJ SOLN
INTRAMUSCULAR | Status: DC | PRN
  Administered 2021-04-01 (×2): 4 mL via EPIDURAL

## 2021-04-01 MED ORDER — FENTANYL-BUPIVACAINE-NACL 0.5-0.125-0.9 MG/250ML-% EP SOLN
12.0000 mL/h | EPIDURAL | Status: DC | PRN
  Administered 2021-04-01: 12 mL/h via EPIDURAL

## 2021-04-01 MED ORDER — OXYCODONE-ACETAMINOPHEN 5-325 MG PO TABS: 2.0000 | ORAL_TABLET | ORAL | Status: DC | PRN

## 2021-04-01 MED ORDER — ACETAMINOPHEN 325 MG PO TABS
650.0000 mg | ORAL_TABLET | ORAL | Status: DC | PRN
  Administered 2021-04-02: 650 mg via ORAL
  Filled 2021-04-01: qty 2

## 2021-04-01 MED ORDER — MISOPROSTOL 25 MCG QUARTER TABLET: 25.0000 ug | ORAL_TABLET | ORAL | Status: DC | PRN

## 2021-04-01 MED ORDER — FENTANYL-BUPIVACAINE-NACL 0.5-0.125-0.9 MG/250ML-% EP SOLN
EPIDURAL | Status: AC
  Filled 2021-04-01: qty 250

## 2021-04-01 MED ORDER — FENTANYL CITRATE (PF) 100 MCG/2ML IJ SOLN: 100.0000 ug | INTRAMUSCULAR | Status: DC | PRN

## 2021-04-01 MED ORDER — DIPHENHYDRAMINE HCL 50 MG/ML IJ SOLN: 12.5000 mg | INTRAMUSCULAR | Status: DC | PRN

## 2021-04-01 MED ORDER — LACTATED RINGERS IV SOLN
500.0000 mL | INTRAVENOUS | Status: DC | PRN
  Administered 2021-04-01: 500 mL via INTRAVENOUS

## 2021-04-01 MED ORDER — LIDOCAINE HCL (PF) 1 % IJ SOLN: 30.0000 mL | INTRAMUSCULAR | Status: DC | PRN

## 2021-04-01 MED ORDER — EPHEDRINE 5 MG/ML INJ
10.0000 mg | INTRAVENOUS | Status: DC | PRN
Start: 2021-04-01 — End: 2021-04-02

## 2021-04-01 MED ORDER — PHENYLEPHRINE 40 MCG/ML (10ML) SYRINGE FOR IV PUSH (FOR BLOOD PRESSURE SUPPORT)
80.0000 ug | PREFILLED_SYRINGE | INTRAVENOUS | Status: DC | PRN
Start: 2021-04-01 — End: 2021-04-02
  Filled 2021-04-01: qty 10

## 2021-04-01 MED ORDER — PHENYLEPHRINE 40 MCG/ML (10ML) SYRINGE FOR IV PUSH (FOR BLOOD PRESSURE SUPPORT)
80.0000 ug | PREFILLED_SYRINGE | INTRAVENOUS | Status: DC | PRN
  Administered 2021-04-01: 80 ug via INTRAVENOUS

## 2021-04-01 MED ORDER — LACTATED RINGERS IV SOLN
500.0000 mL | Freq: Once | INTRAVENOUS | Status: AC
  Administered 2021-04-01: 500 mL via INTRAVENOUS

## 2021-04-01 MED ORDER — ONDANSETRON HCL 4 MG/2ML IJ SOLN: 4.0000 mg | Freq: Four times a day (QID) | INTRAMUSCULAR | Status: DC | PRN

## 2021-04-01 MED ORDER — OXYCODONE-ACETAMINOPHEN 5-325 MG PO TABS
1.0000 | ORAL_TABLET | ORAL | Status: DC | PRN
Start: 2021-04-01 — End: 2021-04-02

## 2021-04-01 MED ORDER — OXYTOCIN-SODIUM CHLORIDE 30-0.9 UT/500ML-% IV SOLN
2.5000 [IU]/h | INTRAVENOUS | Status: DC
  Administered 2021-04-02: 2.5 [IU]/h via INTRAVENOUS
  Filled 2021-04-01: qty 500

## 2021-04-01 MED ORDER — TERBUTALINE SULFATE 1 MG/ML IJ SOLN: 0.2500 mg | Freq: Once | INTRAMUSCULAR | Status: DC | PRN

## 2021-04-01 MED ORDER — OXYTOCIN BOLUS FROM INFUSION
333.0000 mL | Freq: Once | INTRAVENOUS | Status: AC
Start: 2021-04-01 — End: 2021-04-02
  Administered 2021-04-02: 333 mL via INTRAVENOUS

## 2021-04-01 MED ORDER — LACTATED RINGERS IV SOLN: INTRAVENOUS | Status: DC

## 2021-04-01 MED ORDER — SOD CITRATE-CITRIC ACID 500-334 MG/5ML PO SOLN
30.0000 mL | ORAL | Status: DC | PRN
Start: 2021-04-01 — End: 2021-04-02

## 2021-04-01 NOTE — Anesthesia Procedure Notes (Signed)
Epidural Patient location during procedure: OB Start time: 04/01/2021 11:03 PM End time: 04/01/2021 11:12 PM  Staffing Anesthesiologist: Mal Amabile, MD Performed: anesthesiologist   Preanesthetic Checklist Completed: patient identified, IV checked, site marked, risks and benefits discussed, surgical consent, monitors and equipment checked, pre-op evaluation and timeout performed  Epidural Patient position: sitting Prep: DuraPrep and site prepped and draped Patient monitoring: continuous pulse ox and blood pressure Approach: midline Location: L3-L4 Injection technique: LOR air  Needle:  Needle type: Tuohy  Needle gauge: 17 G Needle length: 9 cm and 9 Needle insertion depth: 5 cm cm Catheter type: closed end flexible Catheter size: 19 Gauge Catheter at skin depth: 10 cm Test dose: negative and Other  Assessment Events: blood not aspirated, injection not painful, no injection resistance, no paresthesia and negative IV test  Additional Notes Patient identified. Risks and benefits discussed including failed block, incomplete  Pain control, post dural puncture headache, nerve damage, paralysis, blood pressure Changes, nausea, vomiting, reactions to medications-both toxic and allergic and post Partum back pain. All questions were answered. Patient expressed understanding and wished to proceed. Sterile technique was used throughout procedure. Epidural site was Dressed with sterile barrier dressing. No paresthesias, signs of intravascular injection Or signs of intrathecal spread were encountered.  Patient was more comfortable after the epidural was dosed. Please see RN's note for documentation of vital signs and FHR which are stable. Reason for block:procedure for pain

## 2021-04-01 NOTE — MAU Note (Signed)
Presents with c/o VB that began today, states noticed VB with wiping.  Also reports having ctxs  3-4 minutes apart.  Denies LOF.  Endorses +FM. Pt showed RN picture of VB, VB is mucuosy and appears to be mucus plug.

## 2021-04-01 NOTE — Progress Notes (Signed)
Sara Kelly is a 25 y.o. G2P1001 at [redacted]w[redacted]d admitted for active labor  Subjective:  Patient just got epidural. She is feeling very comfortable at this time.   Objective: BP (!) 99/55   Pulse (!) 115   Temp 98.7 F (37.1 C) (Oral)   Resp 16   Ht 5\' 4"  (1.626 m)   Wt 83.4 kg   LMP 06/22/2020 (Exact Date)   SpO2 98%   BMI 31.55 kg/m  No intake/output data recorded. No intake/output data recorded.  FHT:  FHR: 150 bpm, variability: moderate,  accelerations:  Present,  decelerations:  Present patient had prolonged deceleration after epidural due to hypotension. Nadir of approx 90 for about 2 mins. Responded to position changes, fluid bolus, phenilephyrine.  UC:   regular, every 2-4 minutes SVE:   Dilation: 8 Effacement (%): 90 Station: -1 Exam by:: [email or web address], CNM  Patient agreeable to AROM at this time. AROM for clear fluid  Labs: Lab Results  Component Value Date   WBC 6.8 04/01/2021   HGB 9.2 (L) 04/01/2021   HCT 29.8 (L) 04/01/2021   MCV 99.0 04/01/2021   PLT 188 04/01/2021    Assessment / Plan: Spontaneous labor, progressing normally  Labor: Progressing normally Preeclampsia:   NA Fetal Wellbeing:  Category II Pain Control:  Epidural I/D:  n/a Anticipated MOD:  NSVD  06/01/2021 DNP, CNM  04/01/21  11:52 PM

## 2021-04-01 NOTE — Progress Notes (Signed)
Sara Kelly is a 25 y.o. G2P1001 at [redacted]w[redacted]d by admitted for early labor  Subjective: Feeling increased pressure with contractions. Maybe some leaking that started about one hour ago, but no large gush of fluid.   Objective: BP 122/70   Pulse (!) 120   Temp 98.7 F (37.1 C) (Oral)   Resp 18   Ht 5\' 4"  (1.626 m)   Wt 83.4 kg   LMP 06/22/2020 (Exact Date)   SpO2 98%   BMI 31.55 kg/m  No intake/output data recorded. No intake/output data recorded.  FHT:  FHR: 150 bpm, variability: moderate,  accelerations:  Present,  decelerations:  Present random variable, and one deceleration while patient was on her back for SVE. Resolved once off her back.  UC:   regular, every 3-4 minutes SVE:  6-7/90/-1/BBOW Patient declines AROM at this time. Would like to get in the shower and see if that helps with contraction pain for now, and will consider in the future.   Labs: Lab Results  Component Value Date   WBC 6.8 04/01/2021   HGB 9.2 (L) 04/01/2021   HCT 29.8 (L) 04/01/2021   MCV 99.0 04/01/2021   PLT 188 04/01/2021    Assessment / Plan: Spontaneous labor, progressing normally  Labor: Progressing normally Preeclampsia:   NA Fetal Wellbeing:  Category I Pain Control:  Labor support without medications I/D:  n/a Anticipated MOD:  NSVD  06/01/2021 DNP, CNM  04/01/21  9:17 PM  ]

## 2021-04-01 NOTE — Telephone Encounter (Signed)
Patient calls nurse line regarding contractions that have started this morning. Patient reports onset around 10am. Contractions are occurring around every 20 minutes. Patient reports good fetal movement. Denies loss of fluid or vaginal bleeding.   Reinforced MAU precautions.   Veronda Prude, RN

## 2021-04-01 NOTE — Telephone Encounter (Signed)
Preadmission screen States she just woke up from a nap and is having abdominal pain that comes and goes and bloody mucus discharge.  Instructed to come to MAU for labor evaluation.

## 2021-04-01 NOTE — H&P (Signed)
OBSTETRIC ADMISSION HISTORY AND PHYSICAL  Sara Kelly is a 25 y.o. female G2P1001 with IUP at [redacted]w[redacted]d by LMP + early u/s presenting for SOL. She reports +FMs, No LOF, no VB, no blurry vision, headaches or peripheral edema, and RUQ pain.  She plans on breast  feeding. She request unsure for birth control. She received her prenatal care at [facility]   Dating: By 6 wk u/s--->  Estimated Date of Delivery: 03/29/21  Sono:    @[redacted]w[redacted]d , CWD, normal anatomy, cephalic presentation, posterior placenta , 839g, 37% EFW   Prenatal History/Complications: Echogenic intracardiac focus on , f/u 5/16 normal   Past Medical History: Past Medical History:  Diagnosis Date   Boil of buttock    Normal labor 03/14/2017    Past Surgical History: Past Surgical History:  Procedure Laterality Date   NO PAST SURGERIES      Obstetrical History: OB History     Gravida  2   Para  1   Term  1   Preterm      AB      Living  1      SAB      IAB      Ectopic      Multiple  0   Live Births  1           Social History Social History   Socioeconomic History   Marital status: Single    Spouse name: Not on file   Number of children: Not on file   Years of education: Not on file   Highest education level: Not on file  Occupational History   Not on file  Tobacco Use   Smoking status: Former    Types: Cigars    Quit date: 07/27/2016    Years since quitting: 4.6   Smokeless tobacco: Never  Vaping Use   Vaping Use: Not on file  Substance and Sexual Activity   Alcohol use: No    Comment: quit with +preg   Drug use: Yes    Types: Marijuana    Comment: Stopped once pregnant   Sexual activity: Yes  Other Topics Concern   Not on file  Social History Narrative   Not on file   Social Determinants of Health   Financial Resource Strain: Not on file  Food Insecurity: Not on file  Transportation Needs: Not on file  Physical Activity: Not on file  Stress: Not on file  Social  Connections: Not on file    Family History: Family History  Problem Relation Age of Onset   Healthy Mother    Healthy Father    Diabetes Sister     Allergies: Allergies  Allergen Reactions   Amoxicillin Hives    Has patient had a PCN reaction causing immediate rash, facial/tongue/throat swelling, SOB or lightheadedness with hypotension: No Has patient had a PCN reaction causing severe rash involving mucus membranes or skin necrosis: No Has patient had a PCN reaction that required hospitalization: No Has patient had a PCN reaction occurring within the last 10 years: Yes If all of the above answers are "NO", then may proceed with Cephalosporin use.    Penicillins Hives    Has patient had a PCN reaction causing immediate rash, facial/tongue/throat swelling, SOB or lightheadedness with hypotension: No Has patient had a PCN reaction causing severe rash involving mucus membranes or skin necrosis: No Has patient had a PCN reaction that required hospitalization: No Has patient had a PCN reaction occurring within the last  10 years: Yes If all of the above answers are "NO", then may proceed with Cephalosporin use.    Medications Prior to Admission  Medication Sig Dispense Refill Last Dose   acetaminophen (TYLENOL) 500 MG tablet Take 2 tablets (1,000 mg total) by mouth every 6 (six) hours as needed for moderate pain. 30 tablet 0    aspirin EC 81 MG tablet Take 1 tablet (81 mg total) by mouth daily. Swallow whole. 30 tablet 3    ferrous sulfate 325 (65 FE) MG EC tablet Take 1 tablet (325 mg total) by mouth every other day. 30 tablet 1    Prenatal Vit-Fe Fumarate-FA (PRENATAL MULTIVITAMIN) TABS tablet Take 1 tablet by mouth daily.        Review of Systems   All systems reviewed and negative except as stated in HPI  Blood pressure 139/78, pulse 96, temperature 98.4 F (36.9 C), temperature source Oral, resp. rate 20, height 5\' 4"  (1.626 m), weight 83.4 kg, last menstrual period  06/22/2020, SpO2 98 %, unknown if currently breastfeeding. General appearance: alert, cooperative, and mild distress with contractions  Lungs: Normal WOB Heart: regular rate and rhythm Abdomen: soft, non-tender Pelvic: NEFG Extremities: Homans sign is negative, no sign of DVT Presentation: cephalic Fetal monitoringBaseline: 145 bpm, Variability: Good {> 6 bpm), Accelerations: Reactive, and Decelerations: Absent Uterine activityNone Dilation: 3 Effacement (%): 50 Station: Ballotable Exam by:: [email or web address], RN [phone]   Prenatal labs: ABO, Rh: --/--/PENDING (09/06 1715) Antibody: PENDING (09/06 1715) Rubella: 6.73 (12/28 1656) RPR: Non Reactive (05/31 1602)  HBsAg: Negative (12/28 1656)  HIV: Non Reactive (05/31 1602)  GBS: Negative/-- (08/05 1451)  2 hr Glucola normal  Genetic screening LR NIPS Anatomy 08-18-1982 normal w/ exception of echogenic intracardiac focus   Prenatal Transfer Tool  Maternal Diabetes: No Genetic Screening: Normal Maternal Ultrasounds/Referrals: Isolated EIF (echogenic intracardiac focus) Fetal Ultrasounds or other Referrals:  Fetal echo Maternal Substance Abuse:  No Significant Maternal Medications:  None Significant Maternal Lab Results: Group B Strep negative  Results for orders placed or performed during the hospital encounter of 04/01/21 (from the past 24 hour(s))  Type and screen   Collection Time: 04/01/21  5:15 PM  Result Value Ref Range   ABO/RH(D) PENDING    Antibody Screen PENDING    Sample Expiration      04/04/2021,2359 Performed at [facility], [address] [place], [place]     Patient Active Problem List   Diagnosis Date Noted   Post term pregnancy over 40 weeks 04/01/2021   Supervision of normal pregnancy 08/22/2020   Fall from mobile elevated work platform as cause of accidental injury 08/08/2020   Acne comedone 07/08/2011    Assessment/Plan:  Sara Kelly is a 25 y.o. G2P1001 at [redacted]w[redacted]d here for SOL.    #Labor: Breathing through contractions about every 2-3 minutes, will allow expectant management for now. Consider pit 2x2 if unchanged on next check.  #Pain: PRN #FWB: Cat 1  #ID: GBS negative  #MOF: Breastfeeding  #MOC: Undecided, considering IUD op vs patch  #Circ: N/A  #Anemia of pregnancy: Hgb 9.2 on admit. Consider pp IV venofer pending blood loss.   [redacted]w[redacted]d, MD  04/01/2021, 5:39 PM  GME ATTESTATION:  I saw and evaluated the patient. I agree with the findings and the plan of care as documented in the resident's note.  06/01/2021, DO OB Fellow, Faculty [facility], Center for [facility] 04/01/2021 6:56 PM

## 2021-04-01 NOTE — Anesthesia Preprocedure Evaluation (Addendum)
Anesthesia Evaluation  Patient identified by MRN, date of birth, ID band Patient awake    Reviewed: Allergy & Precautions, Patient's Chart, lab work & pertinent test results  Airway Mallampati: II  TM Distance: >3 FB Neck ROM: Full    Dental no notable dental hx. (+) Teeth Intact   Pulmonary former smoker,    Pulmonary exam normal breath sounds clear to auscultation       Cardiovascular negative cardio ROS Normal cardiovascular exam Rhythm:Regular Rate:Normal     Neuro/Psych negative neurological ROS  negative psych ROS   GI/Hepatic Neg liver ROS, GERD  ,  Endo/Other  Obesity  Renal/GU negative Renal ROS  negative genitourinary   Musculoskeletal negative musculoskeletal ROS (+)   Abdominal (+) + obese,   Peds  Hematology  (+) anemia ,   Anesthesia Other Findings   Reproductive/Obstetrics (+) Pregnancy                             Anesthesia Physical Anesthesia Plan  ASA: 2  Anesthesia Plan: Epidural   Post-op Pain Management:    Induction:   PONV Risk Score and Plan:   Airway Management Planned: Natural Airway  Additional Equipment:   Intra-op Plan:   Post-operative Plan:   Informed Consent: I have reviewed the patients History and Physical, chart, labs and discussed the procedure including the risks, benefits and alternatives for the proposed anesthesia with the patient or authorized representative who has indicated his/her understanding and acceptance.       Plan Discussed with: Anesthesiologist  Anesthesia Plan Comments:         Anesthesia Quick Evaluation

## 2021-04-02 ENCOUNTER — Ambulatory Visit: Payer: Medicaid Other

## 2021-04-02 ENCOUNTER — Encounter (HOSPITAL_COMMUNITY): Payer: Self-pay | Admitting: Obstetrics & Gynecology

## 2021-04-02 DIAGNOSIS — Z3A40 40 weeks gestation of pregnancy: Secondary | ICD-10-CM | POA: Diagnosis not present

## 2021-04-02 LAB — RPR: RPR Ser Ql: NONREACTIVE

## 2021-04-02 MED ORDER — PRENATAL MULTIVITAMIN CH
1.0000 | ORAL_TABLET | Freq: Every day | ORAL | Status: DC
  Administered 2021-04-03: 1 via ORAL
  Filled 2021-04-02: qty 1

## 2021-04-02 MED ORDER — SIMETHICONE 80 MG PO CHEW: 80.0000 mg | CHEWABLE_TABLET | ORAL | Status: DC | PRN

## 2021-04-02 MED ORDER — DIPHENHYDRAMINE HCL 25 MG PO CAPS: 25.0000 mg | ORAL_CAPSULE | Freq: Four times a day (QID) | ORAL | Status: DC | PRN

## 2021-04-02 MED ORDER — DIBUCAINE (PERIANAL) 1 % EX OINT
1.0000 | TOPICAL_OINTMENT | CUTANEOUS | Status: DC | PRN
Start: 2021-04-02 — End: 2021-04-03

## 2021-04-02 MED ORDER — IBUPROFEN 600 MG PO TABS
600.0000 mg | ORAL_TABLET | Freq: Four times a day (QID) | ORAL | Status: DC
  Administered 2021-04-02 – 2021-04-03 (×5): 600 mg via ORAL
  Filled 2021-04-02 (×5): qty 1

## 2021-04-02 MED ORDER — ACETAMINOPHEN 325 MG PO TABS
650.0000 mg | ORAL_TABLET | ORAL | Status: DC | PRN
  Administered 2021-04-02 (×2): 650 mg via ORAL
  Filled 2021-04-02 (×2): qty 2

## 2021-04-02 MED ORDER — ZOLPIDEM TARTRATE 5 MG PO TABS: 5.0000 mg | ORAL_TABLET | Freq: Every evening | ORAL | Status: DC | PRN

## 2021-04-02 MED ORDER — ONDANSETRON HCL 4 MG/2ML IJ SOLN: 4.0000 mg | INTRAMUSCULAR | Status: DC | PRN

## 2021-04-02 MED ORDER — SENNOSIDES-DOCUSATE SODIUM 8.6-50 MG PO TABS
2.0000 | ORAL_TABLET | ORAL | Status: DC
  Administered 2021-04-02 – 2021-04-03 (×2): 2 via ORAL
  Filled 2021-04-02 (×2): qty 2

## 2021-04-02 MED ORDER — OXYCODONE HCL 5 MG PO TABS: 10.0000 mg | ORAL_TABLET | ORAL | Status: DC | PRN

## 2021-04-02 MED ORDER — WITCH HAZEL-GLYCERIN EX PADS: 1.0000 "application " | MEDICATED_PAD | CUTANEOUS | Status: DC | PRN

## 2021-04-02 MED ORDER — BENZOCAINE-MENTHOL 20-0.5 % EX AERO
1.0000 | INHALATION_SPRAY | CUTANEOUS | Status: DC | PRN
Start: 2021-04-02 — End: 2021-04-03

## 2021-04-02 MED ORDER — TETANUS-DIPHTH-ACELL PERTUSSIS 5-2.5-18.5 LF-MCG/0.5 IM SUSY: 0.5000 mL | PREFILLED_SYRINGE | Freq: Once | INTRAMUSCULAR | Status: DC

## 2021-04-02 MED ORDER — OXYCODONE HCL 5 MG PO TABS: 5.0000 mg | ORAL_TABLET | ORAL | Status: DC | PRN

## 2021-04-02 MED ORDER — ONDANSETRON HCL 4 MG PO TABS
4.0000 mg | ORAL_TABLET | ORAL | Status: DC | PRN
Start: 2021-04-02 — End: 2021-04-03

## 2021-04-02 MED ORDER — COCONUT OIL OIL: 1.0000 "application " | TOPICAL_OIL | Status: DC | PRN

## 2021-04-02 NOTE — Lactation Note (Signed)
This note was copied from Sara baby's chart. Lactation Consultation Note  Patient Name: Sara Kelly Today's Date: 04/02/2021 Reason for consult: Initial assessment;Term;Difficult latch Age:23 hours  LC in to room for initial consult. Mother and RN are finger-feeding expressed breast milk upon LC's arrival. Per mother, infant latches but pushes nipple out soon after.  Several attempts to latch infant unsuccessful, only Sara few sucks, no swallows. Noted thin tissue stretching from bottom of tongue, tongue is not passing gum-line. LC shared this information with Sara Furlong, RN.  Mother has been using manual pump. Changed flange size to 27-mm, talked about frequency/care of parts/milk storage. Reviewed normal newborn behavior during first 24h, tummy size, expected output and feeding frequency.   Plan: 1-Skin to skin, aim for Sara deep, comfortable latch and breastfeed on demand or 8-12 times in 24h period. 2-Encouraged maternal rest, hydration and food intake.  3-Contact LC as needed for feeds/support/concerns/questions   All questions answered at this time. Provided Lactation services brochure and promoted [employer] booklet information.    Maternal Data Has patient been taught Hand Expression?: Yes Does the patient have breastfeeding experience prior to this delivery?: Yes How long did the patient breastfeed?: 1 month  Feeding Mother's Current Feeding Choice: Breast Milk  LATCH Score Latch: Repeated attempts needed to sustain latch, nipple held in mouth throughout feeding, stimulation needed to elicit sucking reflex.  Audible Swallowing: Sara few with stimulation  Type of Nipple: Everted at rest and after stimulation  Comfort (Breast/Nipple): Soft / non-tender  Hold (Positioning): Assistance needed to correctly position infant at breast and maintain latch.  LATCH Score: 7   Lactation Tools Discussed/Used Tools: Flanges;Pump Flange Size: 27 Breast pump type: Manual Pump Education:  Milk Storage;Setup, frequency, and cleaning Reason for Pumping: stimulation and supplementation Pumping frequency: as needed  Interventions Interventions: Breast feeding basics reviewed;Assisted with latch;Breast massage;Hand express;Skin to skin;Education;Hand pump;Expressed milk;Adjust position;Support pillows  Discharge Pump: Manual WIC Program: Yes  Consult Status Consult Status: Follow-up Date: 04/03/21 Follow-up type: In-patient    Sara Kelly Sara Kelly Sara Kelly 04/02/2021, 9:44 PM

## 2021-04-02 NOTE — Discharge Summary (Addendum)
Postpartum Discharge Summary      Patient Name: Sara Kelly DOB: [birth year] MRN: [id number]  Date of admission: 04/01/2021 Delivery date:04/02/2021  Delivering provider: Marcille Buffy D  Date of discharge: 04/03/2021  Admitting diagnosis: Post term pregnancy over 40 weeks [O48.0] Intrauterine pregnancy: [redacted]w[redacted]d    Secondary diagnosis:  Active Problems:   Post term pregnancy over 40 weeks  Additional problems: None    Discharge diagnosis: Term Pregnancy Delivered                                              Post partum procedures: None Augmentation: AROM Complications: None  Hospital course: Onset of Labor With Vaginal Delivery      25y.o. yo G2P1001 at 458w4das admitted in Latent Labor on 04/01/2021. Patient had an uncomplicated labor course as follows:  Membrane Rupture Time/Date: 11:43 PM ,04/01/2021   Delivery Method:Vaginal, Spontaneous  Episiotomy: None  Lacerations:  None  Patient had an uncomplicated postpartum course.  She is ambulating, tolerating a regular diet, passing flatus, and urinating well. Patient is discharged home in stable condition on 04/03/21 per her request for early d/c.  Newborn Data: Birth date:04/02/2021  Birth time:1:26 AM  Gender:Female  Living status:Living  Apgars:6 ,9  Weight:3941 g (8lb 11oz)  Magnesium Sulfate received: No BMZ received: No Rhophylac:N/A MMR:N/A T-DaP:Given prenatally Flu: N/A Transfusion:No  Physical exam  Vitals:   04/02/21 1201 04/02/21 1636 04/02/21 2156 04/03/21 0633  BP: 109/60 106/66 108/72 115/77  Pulse: 84 80 88 71  Resp: _0 Temp: 98.2 F (36.8 C) 98 F (36.7 C) 98 F (36.7 C) 98.4 F (36.9 C)  TempSrc: Oral Oral Oral Oral  SpO2: 100% 100% 100% 100%  Weight:      Height:       General: alert, cooperative, and no distress Lochia: appropriate Uterine Fundus: firm Incision: N/A DVT Evaluation: No evidence of DVT seen on physical exam. Labs: Lab Results  Component Value Date   WBC  6.8 04/01/2021   HGB 9.2 (L) 04/01/2021   HCT 29.8 (L) 04/01/2021   MCV 99.0 04/01/2021   PLT 188 04/01/2021   No flowsheet data found. Edinburgh Score: Edinburgh Postnatal Depression Scale Screening Tool 04/03/2021  I have been able to laugh and see the funny side of things. 0  I have looked forward with enjoyment to things. 0  I have blamed myself unnecessarily when things went wrong. 0  I have been anxious or worried for no good reason. 0  I have felt scared or panicky for no good reason. 0  Things have been getting on top of me. 0  I have been so unhappy that I have had difficulty sleeping. 0  I have felt sad or miserable. 0  I have been so unhappy that I have been crying. 0  The thought of harming myself has occurred to me. 0  Edinburgh Postnatal Depression Scale Total 0     After visit meds:  Allergies as of 04/03/2021       Reactions   Amoxicillin Hives   Has patient had a PCN reaction causing immediate rash, facial/tongue/throat swelling, SOB or lightheadedness with hypotension: No Has patient had a PCN reaction causing severe rash involving mucus membranes or skin necrosis: No Has patient had a PCN reaction that required hospitalization: No Has patient  had a PCN reaction occurring within the last 10 years: Yes If all of the above answers are "NO", then may proceed with Cephalosporin use.   Penicillins Hives   Has patient had a PCN reaction causing immediate rash, facial/tongue/throat swelling, SOB or lightheadedness with hypotension: No Has patient had a PCN reaction causing severe rash involving mucus membranes or skin necrosis: No Has patient had a PCN reaction that required hospitalization: No Has patient had a PCN reaction occurring within the last 10 years: Yes If all of the above answers are "NO", then may proceed with Cephalosporin use.        Medication List     TAKE these medications    acetaminophen 500 MG tablet Commonly known as: TYLENOL Take 2  tablets (1,000 mg total) by mouth every 6 (six) hours as needed for moderate pain.   aspirin EC 81 MG tablet Take 1 tablet (81 mg total) by mouth daily. Swallow whole.   ferrous sulfate 325 (65 FE) MG EC tablet Take 1 tablet (325 mg total) by mouth every other day.   prenatal multivitamin Tabs tablet Take 1 tablet by mouth daily.         Discharge home in stable condition Infant Feeding: Breast Infant Disposition:home with mother Discharge instruction: per After Visit Summary and Postpartum booklet. Activity: Advance as tolerated. Pelvic rest for 6 weeks.  Diet: routine diet Future Appointments: Future Appointments  Date Time Provider [place]  05/14/2021 10:10 AM Rise Patience, DO [facility] [place]   Follow up Visit:   Please schedule this patient for a In person postpartum visit in 4 weeks with the following provider: Any provider. Additional Postpartum F/U: IUD insertion   Low risk pregnancy complicated by:  None Delivery mode:  Vaginal, Spontaneous  Anticipated Birth Control:  IUD  Myrtis Ser [facility] 04/03/2021 9:17 AM

## 2021-04-02 NOTE — Anesthesia Postprocedure Evaluation (Signed)
Anesthesia Post Note  Patient: Sara Kelly  Procedure(s) Performed: AN AD HOC LABOR EPIDURAL     Patient location during evaluation: Mother Baby Anesthesia Type: Epidural Level of consciousness: awake Pain management: satisfactory to patient Vital Signs Assessment: post-procedure vital signs reviewed and stable Respiratory status: spontaneous breathing Cardiovascular status: stable Anesthetic complications: no   No notable events documented.  Last Vitals:  Vitals:   04/02/21 0830 04/02/21 1201  BP: 110/80 109/60  Pulse: 95 84  Resp: 17 18  Temp: 36.8 C 36.8 C  SpO2: 99% 100%    Last Pain:  Vitals:   04/02/21 1500  TempSrc:   PainSc: 3    Pain Goal:                   [employer]

## 2021-04-02 NOTE — Lactation Note (Signed)
This note was copied from a baby's chart. Lactation Consultation Note  Patient Name: Sara Kelly [id number] Date: 04/02/2021 Reason for consult: L&D Initial assessment;Term Age:23 hours P2, term female infant. LC entered the room, mom was doing skin to skin and infant was cuing to breastfeed. Mom latched infant on her right breast using the football hold position, infant sustained latch and breastfeed for 15 minutes. Afterwards mom hand expressed and infant was given 8 mls of colostrum by spoon. Mom knows to breastfeed infant according to primal cues: licking, kissing,rooting, hands and fist in mouth, breastfeeding infant skin to skin. Mom knows to ask RN/LC on MBU if she needs further assistance with latching infant at the breast. Mom would like to receive hand pump on MBU for prn use. Maternal Data Has patient been taught Hand Expression?: Yes Does the patient have breastfeeding experience prior to this delivery?: Yes How long did the patient breastfeed?: Per mom, she BF her 60 year old daughter for 1 month.  Feeding Mother's Current Feeding Choice: Breast Milk  LATCH Score Latch: Grasps breast easily, tongue down, lips flanged, rhythmical sucking.  Audible Swallowing: Spontaneous and intermittent  Type of Nipple: Everted at rest and after stimulation  Comfort (Breast/Nipple): Soft / non-tender  Hold (Positioning): Assistance needed to correctly position infant at breast and maintain latch.  LATCH Score: 9   Lactation Tools Discussed/Used    Interventions Interventions: Breast feeding basics reviewed;Assisted with latch;Skin to skin;Hand express;Breast compression;Adjust position;Support pillows;Position options;Expressed milk;Education  Discharge WIC Program: Yes  Consult Status Consult Status: Follow-up Date: 04/02/21 Follow-up type: In-patient    Danelle Earthly 04/02/2021, 2:55 AM

## 2021-04-03 DIAGNOSIS — Z3A40 40 weeks gestation of pregnancy: Secondary | ICD-10-CM | POA: Diagnosis not present

## 2021-04-03 NOTE — Lactation Note (Signed)
This note was copied from a baby's chart. Lactation Consultation Note  Patient Name: Sara Kelly [id number] Date: 04/03/2021 Reason for consult: Follow-up assessment;Mother's request;Difficult latch;Early term 37-38.6wks;Infant weight loss Age:23 hours  Mom pumped getting 25 ml of EBM. LC assisted Mom with pace bottle feeding infant with yellow slow flow nipple since infant did not latch at the breast.   Plan 1 To feed based on cues 8-12x in 24 hr period no more than 4 hrs without a feeding.  2. Mom to offer breasts and look for signs of milk transfer.  3. Mom to supplement with EBP pace bottle feeding 7-12 ml. Mom to offer more if infant not latching at breast.  4. Mom to pump with manual q 3 hrs for 10 min each breast 5. I and O sheet provided to track feedings at home.  6. LC brochure reviewed of outpatient and inpatient services.  All questions answered at the end of the visit.   Maternal Data    Feeding Mother's Current Feeding Choice: Breast Milk Nipple Type: Slow - flow  LATCH Score                    Lactation Tools Discussed/Used    Interventions Interventions: Breast feeding basics reviewed;Hand pump;Hand express;Expressed milk;Education;Pace feeding  Discharge Discharge Education: Engorgement and breast care;Warning signs for feeding baby  Consult Status Consult Status: Complete Date: 04/04/21 Follow-up type: In-patient    Lavere Stork  Nicholson-Springer 04/03/2021, 3:26 PM

## 2021-04-04 ENCOUNTER — Telehealth: Payer: Self-pay

## 2021-04-04 NOTE — Telephone Encounter (Signed)
Transition Care Management Unsuccessful Follow-up Telephone Call  Date of discharge and from where:  04/03/2021-[facility]   Attempts:  1st Attempt  Reason for unsuccessful TCM follow-up call:  Unable to reach patient

## 2021-04-05 ENCOUNTER — Inpatient Hospital Stay (HOSPITAL_COMMUNITY): Payer: Medicaid Other

## 2021-04-05 ENCOUNTER — Inpatient Hospital Stay (HOSPITAL_COMMUNITY)
Admission: AD | Admit: 2021-04-05 | Payer: Medicaid Other | Source: Home / Self Care | Admitting: Obstetrics & Gynecology

## 2021-04-07 NOTE — Telephone Encounter (Signed)
Transition Care Management Follow-up Telephone Call Date of discharge and from where: 04/03/2021 from [facility] How have you been since you were released from the hospital? Pt stated that thing are going good and did not have any questions or concerns at this time.  Any questions or concerns? No  Items Reviewed: Did the pt receive and understand the discharge instructions provided? Yes  Medications obtained and verified? Yes  Other? No  Any new allergies since your discharge? No  Dietary orders reviewed? No Do you have support at home? Yes   Functional Questionnaire: (I = Independent and D = Dependent) ADLs: I  Bathing/Dressing- I  Meal Prep- I  Eating- I  Maintaining continence- I  Transferring/Ambulation- I  Managing Meds- I   Follow up appointments reviewed:  PCP Hospital f/u appt confirmed? No   Specialist Hospital f/u appt confirmed? Yes  Scheduled to see Alana Lilland, DO on 05/14/2021 @ 10:10am. Are transportation arrangements needed? No  If their condition worsens, is the pt aware to call PCP or go to the Emergency Dept.? Yes Was the patient provided with contact information for the PCP's office or ED? Yes Was to pt encouraged to call back with questions or concerns? Yes

## 2021-04-16 ENCOUNTER — Telehealth (HOSPITAL_COMMUNITY): Payer: Self-pay | Admitting: *Deleted

## 2021-04-16 NOTE — Telephone Encounter (Signed)
Left message to return nurse call.  Duffy Rhody, RN 04-16-2021 at 3:05pm

## 2021-05-14 ENCOUNTER — Ambulatory Visit: Payer: Medicaid Other | Admitting: Family Medicine

## 2021-06-01 IMAGING — US US MFM OB COMP +14 WKS
1 series · 13 of 28 positions shown · non-contrast
Comparison: none

[Series 1: us mfm ob comp +14 wks · 151 acquisitions, 13 frames shown]
[im 6/151]
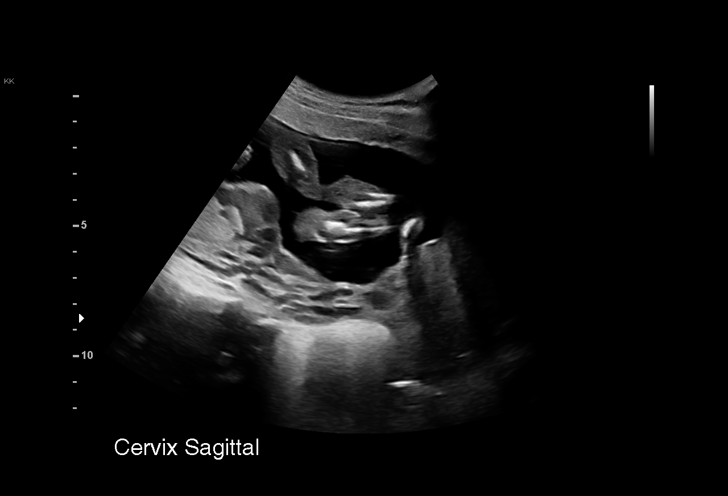
[im 17/151]
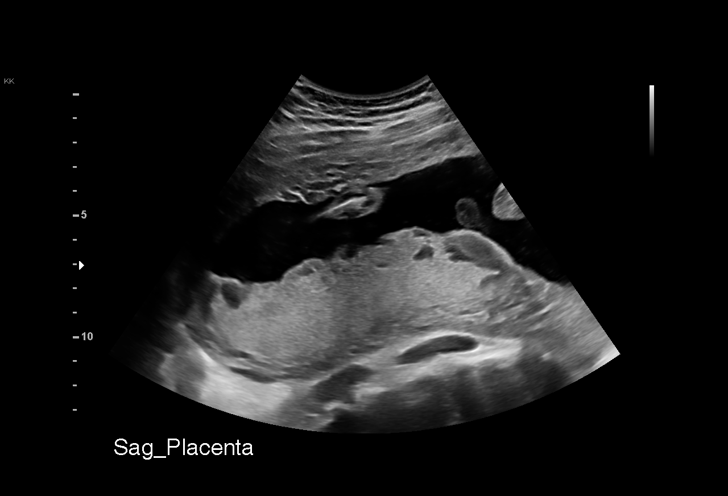
[im 28/151]
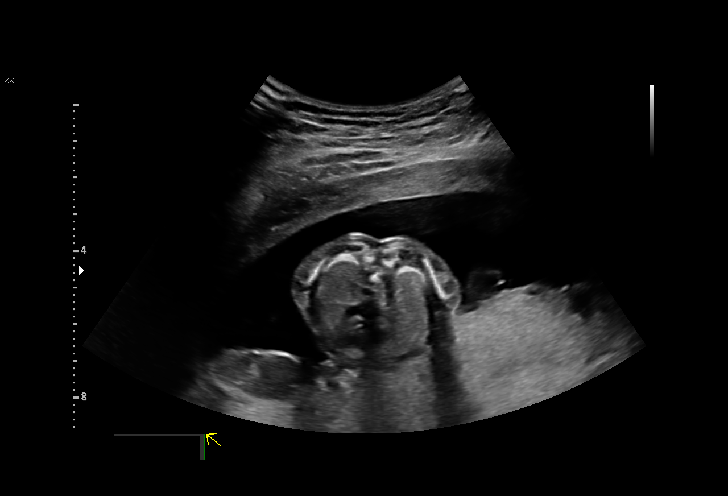
[im 39/151]
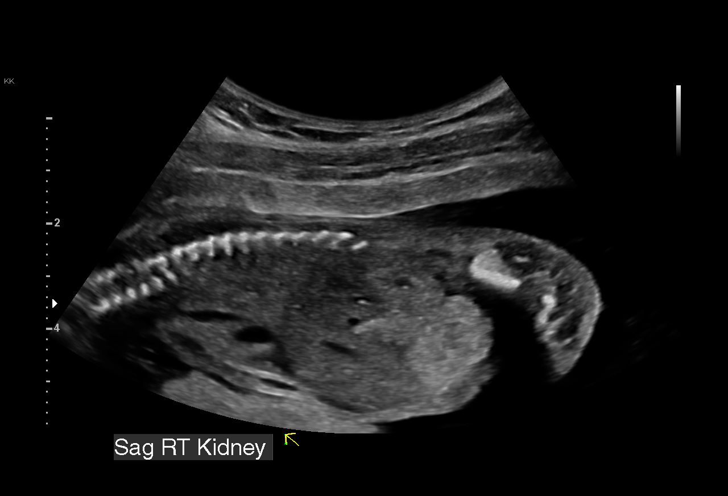
[im 51/151]
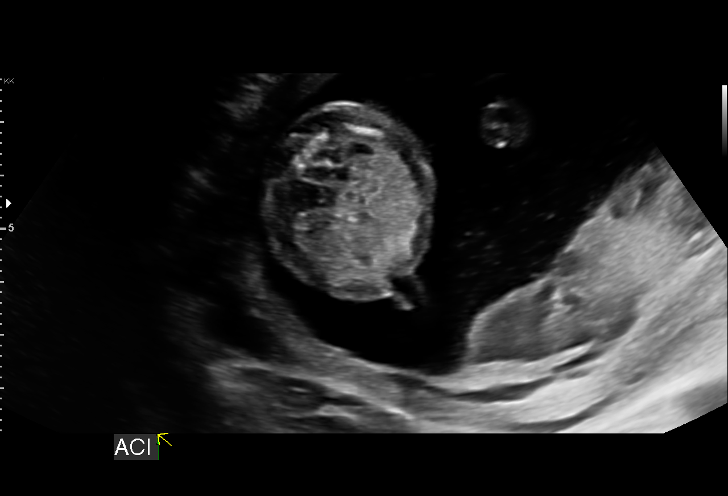
[im 62/151]
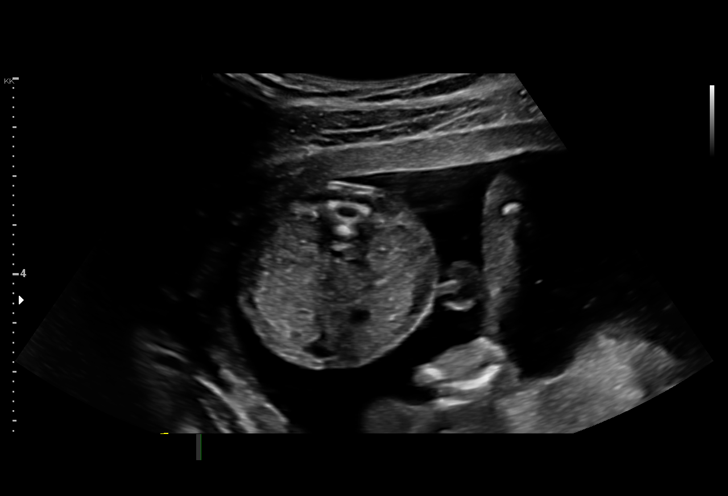
[im 78/151]
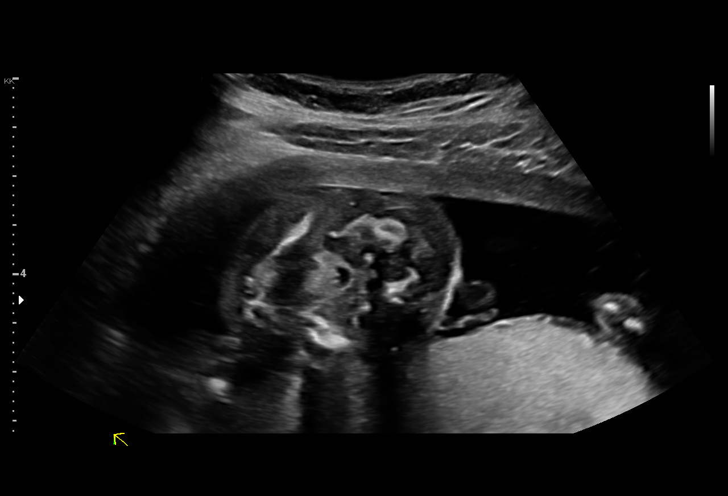
[im 89/151]
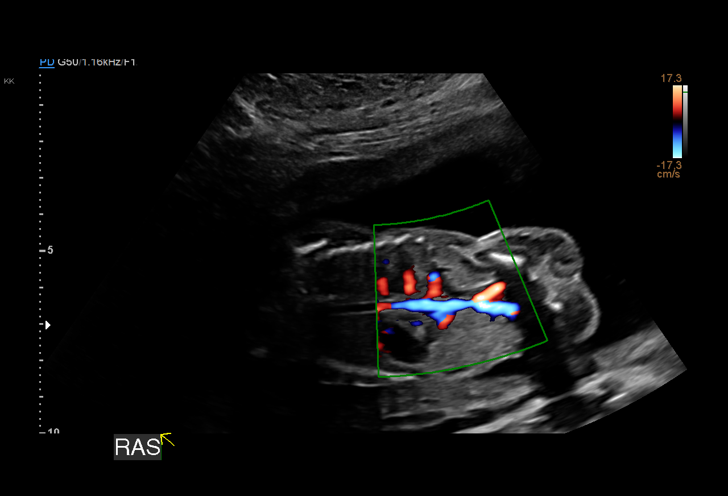
[im 101/151]
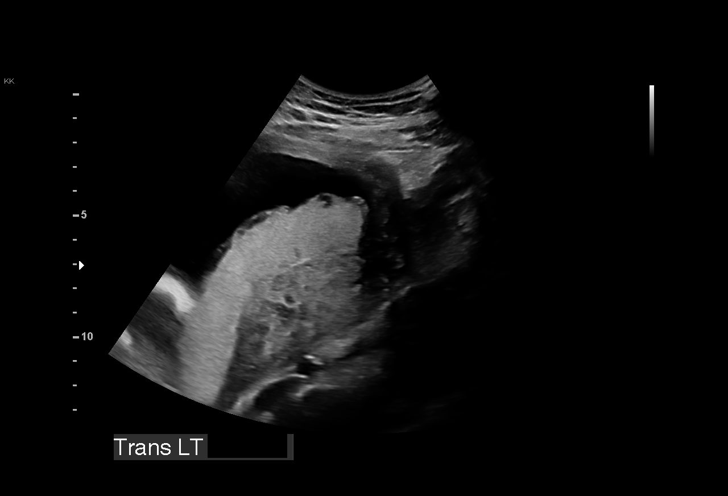
[im 112/151]
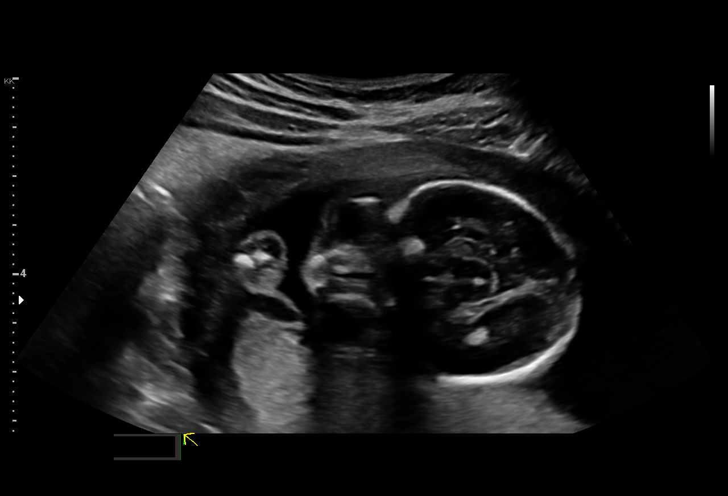
[im 123/151]
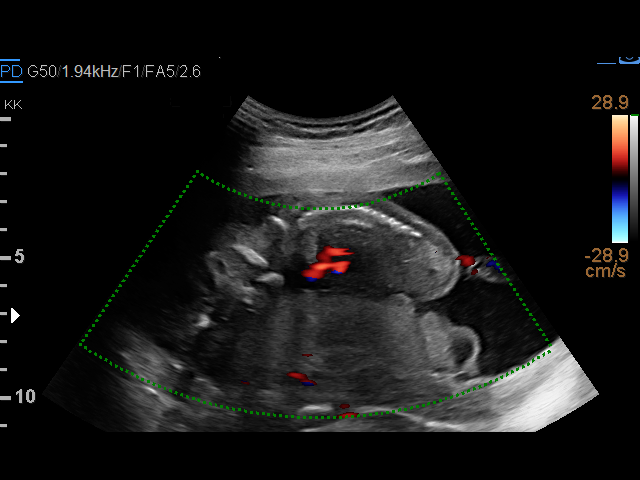
[im 134/151]
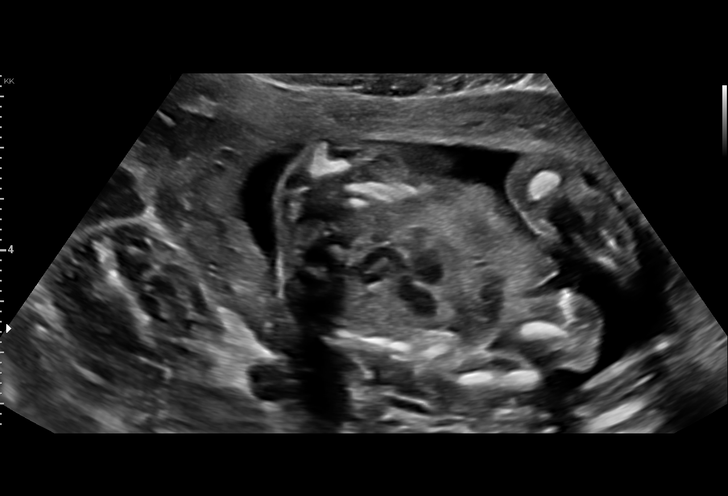
[im 145/151]
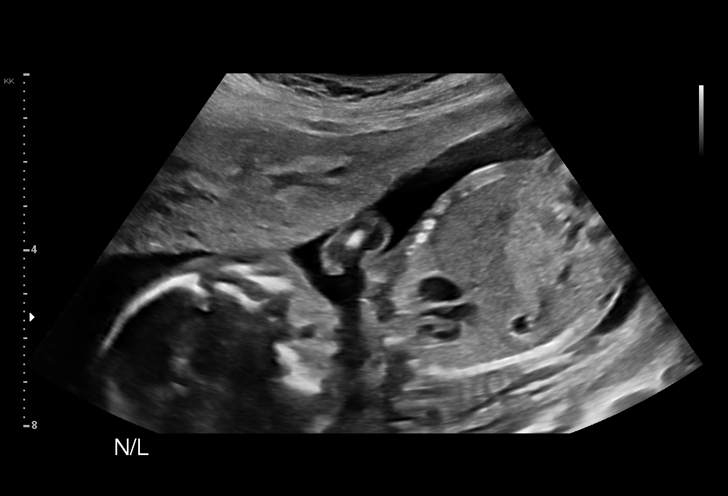

[13 of 28 positions shown; findings below may reference images not displayed]

1  US MFM OB COMP + 14 WK                76805.01    EIAN LOPRESTI

Indications

 Encounter for antenatal screening for
 malformations
 19 weeks gestation of pregnancy
 Low Risk NIPS(Negative Horizon [DATE])
Vital Signs

 BMI:
Fetal Evaluation

 Num Of Fetuses:         1
 Fetal Heart Rate(bpm):  148
 Cardiac Activity:       Observed
 Presentation:           Breech
 Placenta:               Posterior
 P. Cord Insertion:      Visualized

 Amniotic Fluid
 AFI FV:      Within normal limits

                             Largest Pocket(cm)

Biometry
 BPD:      39.2  mm     G. Age:  17w 6d          6  %    CI:        67.81   %    70 - 86
                                                         FL/HC:      20.4   %    16.1 -
 HC:      152.3  mm     G. Age:  18w 2d          6  %    HC/AC:      1.13        1.09 -
 AC:      134.2  mm     G. Age:  18w 6d         32  %    FL/BPD:     79.1   %
 FL:         31  mm     G. Age:  19w 4d         55  %    FL/AC:      23.1   %    20 - 24
 HUM:      28.9  mm     G. Age:  19w 3d         54  %
 CER:      19.6  mm     G. Age:  19w 0d         38  %
 NFT:       3.7  mm
 LV:        5.9  mm
 CM:        3.4  mm

 Est. FW:     274  gm    0 lb 10 oz      35  %
OB History

 Gravidity:    2         Term:   1        Prem:   0        SAB:   0
 TOP:          0       Ectopic:  0        Living: 1
Gestational Age

 LMP:           19w 2d        Date:  06/22/20                 EDD:   03/29/21
 U/S Today:     18w 5d                                        EDD:   04/02/21
 Best:          19w 2d     Det. By:  LMP  (06/22/20)          EDD:   03/29/21
Anatomy

 Cranium:               Appears normal         Aortic Arch:            Appears normal
 Cavum:                 Appears normal         Ductal Arch:            Appears normal
 Ventricles:            Appears normal         Diaphragm:              Appears normal
 Choroid Plexus:        Appears normal         Stomach:                Appears normal, left
                                                                       sided
 Cerebellum:            Appears normal         Abdomen:                Appears normal
 Posterior Fossa:       Appears normal         Abdominal Wall:         Appears nml (cord
                                                                       insert, abd wall)
 Nuchal Fold:           Appears normal         Cord Vessels:           Appears normal (3
                                                                       vessel cord)
 Face:                  Orbits appear          Kidneys:                Appear normal
                        normal
 Lips:                  Appears normal         Bladder:                Appears normal
 Thoracic:              Appears normal         Spine:                  Appears normal
 Heart:                 Not well visualized    Upper Extremities:      Appears normal
 RVOT:                  Appears normal         Lower Extremities:      Appears normal
 LVOT:                  Appears normal

 Other:  Fetus appears to be female. Heels visualized. Open hands visualized.
Cervix Uterus Adnexa

 Cervix
 Length:           4.16  cm.
 Normal appearance by transabdominal scan.

 Adnexa
 No abnormality visualized.
Impression

 Single intrauterine pregnancy here for a detailed anatomy
 Normal anatomy with measurements consistent with dates
 There is good fetal movement and amniotic fluid volume
 Suboptimal views of the fetal anatomy were obtained
 secondary to fetal position.

 An echogenic intracardiac focus was observed today. I
 discussed that there is no increased risk to the function or
 structure of the heart. In addition, in the context of a low risk
 NIPS result the risk for aneuploidy are reduced. There were
 no additional markers of aneuploidy observed.  I reviewed
 that an ultrasound is a screening exam and that a diagnostic
 exam via amnioticenetesis is the only test available to provide
 a definitive result. At this time Ms. Raymond Olivier declined any
 additional testing.
Recommendations

 Follow up growth in 4-6 weeks.

## 2021-06-03 ENCOUNTER — Other Ambulatory Visit: Payer: Self-pay

## 2021-06-03 ENCOUNTER — Ambulatory Visit (INDEPENDENT_AMBULATORY_CARE_PROVIDER_SITE_OTHER): Payer: Medicaid Other | Admitting: Family Medicine

## 2021-06-03 ENCOUNTER — Encounter: Payer: Self-pay | Admitting: Family Medicine

## 2021-06-03 DIAGNOSIS — Z3009 Encounter for other general counseling and advice on contraception: Secondary | ICD-10-CM

## 2021-06-03 DIAGNOSIS — Z3402 Encounter for supervision of normal first pregnancy, second trimester: Secondary | ICD-10-CM | POA: Diagnosis not present

## 2021-06-03 DIAGNOSIS — D508 Other iron deficiency anemias: Secondary | ICD-10-CM

## 2021-06-03 DIAGNOSIS — O99013 Anemia complicating pregnancy, third trimester: Secondary | ICD-10-CM | POA: Diagnosis not present

## 2021-06-03 NOTE — Assessment & Plan Note (Signed)
Patient would like Mirena IUD for postpartum contraception. No contraindications. She will schedule separate appt for IUD placement. Counseled on importance of using alternate form of birth control in the meantime.

## 2021-06-03 NOTE — Progress Notes (Signed)
    SUBJECTIVE:   CHIEF COMPLAINT / HPI:   Postpartum Visit Patient had spontaneous vaginal delivery on 04/02/2021, no complications, no lacerations, was discharged home after 24hrs. Pregnancy was uncomplicated other than anemia.  She reports she is doing "great" today. Has no concerns or complaints. States her mood is good, no symptoms of depression or anxiety. She is not breastfeeding-- tried for the first ~3 weeks and then switched to formula exclusively. No difficulty with voiding/stooling. She has resumed sexual activity and has no pain or concerns. Is not currently using anything for contraception. She would like an IUD (no contraindications to hormonal IUD).  PERTINENT  PMH / PSH: Anemia of pregnancy  OBJECTIVE:   BP 102/60   Pulse 82   Ht 5\' 4"  (1.626 m)   Wt 160 lb 12.8 oz (72.9 kg)   LMP 06/22/2020 (Exact Date)   SpO2 99%   Breastfeeding Yes   BMI 27.60 kg/m   General: NAD, pleasant, able to participate in exam CV: RRR, normal S1/S2 without m/r/g Respiratory: No respiratory distress Abd: soft, nontender Skin: warm and dry, no rashes noted Psych: Normal affect and mood Neuro: grossly intact   ASSESSMENT/PLAN:   Postpartum Visit Patient is 8 weeks postpartum from spontaneous vaginal delivery without complication. She is doing well without complaints. Edinburgh score 0. Offered patient pelvic exam but she declined. This is reasonable as she had no lacerations, has already resumed sexual activity without difficulty, and will have pelvic exam at upcoming appt for IUD placement. UTD on pap (NILM 08/22/2020). She is not breastfeeding. No indication for 2 hr gtt. Anticipatory guidance discussed: -Return to sexual activity -Contraception (see below)  Contraception management Patient would like Mirena IUD for postpartum contraception. No contraindications. She will schedule separate appt for IUD placement. Counseled on importance of using alternate form of birth control in  the meantime.   Anemia of Pregnancy Hgb 9.2 at time of delivery. She is not taking iron supplementation or prenatal vitamin currently. CBC and ferritin obtained today.   08/24/2020, MD [facility]

## 2021-06-03 NOTE — Patient Instructions (Addendum)
It was great to see you!  Congratulations on your beautiful baby girl! I'm glad to hear you're doing well since delivery.  It is ok to resume sexual activity, but please use a reliable form of contraception (birth control) until your IUD is placed.   We will check your hemoglobin level today, since it was low during pregnancy.   Take care and seek immediate care sooner if you develop any concerns.  Dr. [facility]

## 2021-06-04 LAB — CBC
Hematocrit: 31.5 % — ABNORMAL LOW (ref 34.0–46.6)
Hemoglobin: 10.5 g/dL — ABNORMAL LOW (ref 11.1–15.9)
MCH: 29.7 pg (ref 26.6–33.0)
MCHC: 33.3 g/dL (ref 31.5–35.7)
MCV: 89 fL (ref 79–97)
Platelets: 243 10*3/uL (ref 150–450)
RBC: 3.53 x10E6/uL — ABNORMAL LOW (ref 3.77–5.28)
RDW: 14.6 % (ref 11.7–15.4)
WBC: 3.4 10*3/uL (ref 3.4–10.8)

## 2021-06-04 LAB — FERRITIN: Ferritin: 12 ng/mL — ABNORMAL LOW (ref 15–150)

## 2021-06-04 MED ORDER — FERROUS SULFATE 325 (65 FE) MG PO TBEC: 325.0000 mg | DELAYED_RELEASE_TABLET | ORAL | 1 refills | Status: AC

## 2021-06-04 NOTE — Addendum Note (Signed)
Addended by: Maury Dus on: 06/04/2021 09:54 AM   Modules accepted: Orders

## 2021-06-12 ENCOUNTER — Ambulatory Visit: Payer: Medicaid Other

## 2021-07-03 ENCOUNTER — Ambulatory Visit: Payer: Medicaid Other

## 2021-08-21 ENCOUNTER — Ambulatory Visit: Payer: Medicaid Other

## 2021-12-15 ENCOUNTER — Emergency Department (HOSPITAL_COMMUNITY)
Admission: EM | Admit: 2021-12-15 | Discharge: 2021-12-15 | Disposition: A | Discharge status: Home / Self Care | Payer: Medicaid Other | Attending: Emergency Medicine | Admitting: Emergency Medicine

## 2021-12-15 ENCOUNTER — Encounter (HOSPITAL_COMMUNITY): Payer: Self-pay | Admitting: Emergency Medicine

## 2021-12-15 DIAGNOSIS — N3090 Cystitis, unspecified without hematuria: Secondary | ICD-10-CM | POA: Insufficient documentation

## 2021-12-15 DIAGNOSIS — Z87891 Personal history of nicotine dependence: Secondary | ICD-10-CM | POA: Diagnosis not present

## 2021-12-15 DIAGNOSIS — R300 Dysuria: Secondary | ICD-10-CM | POA: Diagnosis present

## 2021-12-15 LAB — URINALYSIS, ROUTINE W REFLEX MICROSCOPIC
Bilirubin Urine: NEGATIVE
Glucose, UA: NEGATIVE mg/dL
Ketones, ur: NEGATIVE mg/dL
Nitrite: NEGATIVE
Protein, ur: NEGATIVE mg/dL
Specific Gravity, Urine: 1.012 (ref 1.005–1.030)
WBC, UA: >50 WBC/hpf — ABNORMAL HIGH (ref 0–5)
pH: 6 (ref 5.0–8.0)

## 2021-12-15 LAB — PREGNANCY, URINE: Preg Test, Ur: NEGATIVE

## 2021-12-15 MED ORDER — NITROFURANTOIN MONOHYD MACRO 100 MG PO CAPS: 100.0000 mg | ORAL_CAPSULE | Freq: Two times a day (BID) | ORAL | 0 refills | Status: DC

## 2021-12-15 NOTE — ED Provider Notes (Signed)
[facility] EMERGENCY DEPARTMENT Provider Note   CSN: [id number] Arrival date & time: 12/15/21  1052     History  Chief Complaint  Patient presents with   Dysuria    Sara Kelly is a 26 y.o. female.   Dysuria Associated symptoms: no abdominal pain, no fever and no vomiting    26 year old female presents emergency department with complaints of dysuria.  Patient says that symptoms have been intermittent over the past 2 weeks, but have gotten progressively worse over the past couple days.  She also states associated symptoms of urinary frequency/feelings of incomplete voiding.  She describes the pain as dull and achy.  She is noted no obvious discharge.  She denies fever, chills, night sweats, chest pain, shortness of breath, abdominal pain, nausea/vomiting/diarrhea, vaginal symptoms, change in bowel habits.   Home Medications Prior to Admission medications   Medication Sig Start Date End Date Taking? Authorizing Provider  nitrofurantoin, macrocrystal-monohydrate, (MACROBID) 100 MG capsule Take 1 capsule (100 mg total) by mouth 2 (two) times daily. 12/15/21  Yes Sherian Maroon A, PA  ferrous sulfate 325 (65 FE) MG EC tablet Take 1 tablet (325 mg total) by mouth every other day. 06/04/21   Maury Dus, MD      Allergies    Amoxicillin and Penicillins    Review of Systems   Review of Systems  Constitutional:  Negative for chills and fever.  HENT:  Negative for congestion, sore throat and tinnitus.   Respiratory:  Negative for cough and wheezing.   Cardiovascular:  Negative for chest pain and palpitations.  Gastrointestinal:  Negative for abdominal pain, diarrhea and vomiting.  Genitourinary:  Positive for dysuria.  Musculoskeletal:  Negative for back pain.  Skin:  Negative for pallor.  Neurological:  Negative for headaches.   Physical Exam Updated Vital Signs BP 109/66 (BP Location: Left Arm)   Pulse (!) 108   Temp 99.3 F (37.4 C) (Oral)   Resp  20   LMP  (LMP Unknown) Comment: last 2 months on 25th  SpO2 100%  Physical Exam Vitals and nursing note reviewed.  Constitutional:      General: She is not in acute distress.    Appearance: Normal appearance. She is normal weight. She is not ill-appearing, toxic-appearing or diaphoretic.  HENT:     Head: Normocephalic and atraumatic.     Nose: Nose normal.     Mouth/Throat:     Mouth: Mucous membranes are moist.     Pharynx: Oropharynx is clear.  Eyes:     General:        Right eye: No discharge.        Left eye: No discharge.     Extraocular Movements: Extraocular movements intact.     Conjunctiva/sclera: Conjunctivae normal.  Cardiovascular:     Rate and Rhythm: Regular rhythm.     Pulses: Normal pulses.     Heart sounds: Normal heart sounds.  Pulmonary:     Effort: Pulmonary effort is normal.     Breath sounds: Normal breath sounds.  Abdominal:     General: Abdomen is flat. Bowel sounds are normal.     Palpations: Abdomen is soft.     Tenderness: There is no right CVA tenderness, left CVA tenderness or guarding.     Comments: Minimal suprapubic discomfort upon palpation.  No left or right CVA tenderness noted.  Musculoskeletal:        General: Normal range of motion.     Cervical back:  Normal range of motion and neck supple.  Skin:    General: Skin is warm and dry.     Capillary Refill: Capillary refill takes less than 2 seconds.  Neurological:     General: No focal deficit present.     Mental Status: She is alert and oriented to person, place, and time.  Psychiatric:        Mood and Affect: Mood normal.        Behavior: Behavior normal.    ED Results / Procedures / Treatments   Labs (all labs ordered are listed, but only abnormal results are displayed) Labs Reviewed  URINALYSIS, ROUTINE W REFLEX MICROSCOPIC - Abnormal; Notable for the following components:      Result Value   Hgb urine dipstick MODERATE (*)    Leukocytes,Ua LARGE (*)    WBC, UA >50 (*)     Bacteria, UA RARE (*)    All other components within normal limits  URINE CULTURE  PREGNANCY, URINE    EKG None  Radiology No results found.  Procedures Procedures    Medications Ordered in ED Medications - No data to display  ED Course/ Medical Decision Making/ A&P                           Medical Decision Making Risk Prescription drug management.   This patient presents to the ED for concern of dysuria, this involves an extensive number of treatment options, and is a complaint that carries with it a high risk of complications and morbidity.  The differential diagnosis includes cystitis, pyelonephritis, STD/STI, trauma to genitourinary area, rash   Co morbidities that complicate the patient evaluation  2 prior vaginal deliveries   Lab Tests:  I Ordered, and personally interpreted labs.  The pertinent results include: UA significant for large leukocytes, greater than 50 WBC, bacteria rare, hemoglobin moderate Urine culture pending upon discharge.  Imaging Studies ordered:  N/a  Cardiac Monitoring: / EKG:  The patient was maintained on a cardiac monitor.  I personally viewed and interpreted the cardiac monitored which showed an underlying rhythm of: Sinus rhythm   Consultations Obtained:  N/a   Problem List / ED Course / Critical interventions / Medication management  Cystitis Reevaluation of the patient showed that the patient stayed the same I have reviewed the patients home medicines and have made adjustments as needed   Social Determinants of Health:  Cigar smoking 07/27/2016 ceased   Test / Admission - Considered:  Dysuria Vital signs within normal range and stable throughout visit Laboratory studies significant for UTI. No concern for pyelonephritis due to lack of fever and lack of CVA tenderness.  Patient prescribed 5 days of Macrobid for appropriate antibiotic therapy.  He has an 7-month-old child at home but is no longer  breast-feeding. Offered screening for STD/STI patient refused secondary to trust of partner. Information was provided for patient to set up primary care provider/OB/GYN for further treatment or management of patient's health concerns. Worrisome signs and symptoms were discussed with the patient and the patient knowledge understanding to return to the emergency department if noticed.  She was stable upon discharge.         Final Clinical Impression(s) / ED Diagnoses Final diagnoses:  Cystitis    Rx / DC Orders ED Discharge Orders          Ordered    nitrofurantoin, macrocrystal-monohydrate, (MACROBID) 100 MG capsule  2 times daily  12/15/21 1400              Peter GarterRobbins, Lillyanna Glandon A, GeorgiaPA 12/15/21 1420    Benjiman CorePickering, Nathan, MD 12/18/21 1445

## 2021-12-15 NOTE — ED Triage Notes (Signed)
Pt reports urinary frequency/retention with pressure/pain a few weeks ago, pt is now having feelings of retention. Pt started her period yesterday with subjective fever/chills last night denies v/d.  Unsure of pregnancy status.

## 2021-12-15 NOTE — Discharge Instructions (Signed)
Please take antibiotic as prescribed.  1 tablet twice a day for 5 days.  Download my chart for results on bacterial culture.  This will direct future antibiotic therapy if it needs to be changed.  Please not hesitate if the worrisome signs and symptoms we discussed become apparent. Please do not hesitate if the worrisome signs and symptoms we discussed become apparent.

## 2021-12-16 ENCOUNTER — Telehealth: Payer: Self-pay

## 2021-12-16 NOTE — Telephone Encounter (Signed)
Transition Care Management Follow-up Telephone Call Date of discharge and from where: 12/15/2021-[facility]  How have you been since you were released from the hospital? Pt stated she is doing better.  Any questions or concerns? No  Items Reviewed: Did the pt receive and understand the discharge instructions provided? Yes  Medications obtained and verified? Yes  Other? No  Any new allergies since your discharge? No  Dietary orders reviewed? No Do you have support at home? Yes   Home Care and Equipment/Supplies: Were home health services ordered? not applicable If so, what is the name of the agency? N/A  Has the agency set up a time to come to the patient's home? not applicable Were any new equipment or medical supplies ordered?  No What is the name of the medical supply agency? N/A Were you able to get the supplies/equipment? not applicable Do you have any questions related to the use of the equipment or supplies? No  Functional Questionnaire: (I = Independent and D = Dependent) ADLs: I  Bathing/Dressing- I  Meal Prep- I  Eating- I  Maintaining continence- I  Transferring/Ambulation- I  Managing Meds- I  Follow up appointments reviewed:  PCP Hospital f/u appt confirmed? No   Specialist Hospital f/u appt confirmed? No   Are transportation arrangements needed? No  If their condition worsens, is the pt aware to call PCP or go to the Emergency Dept.? Yes Was the patient provided with contact information for the PCP's office or ED? Yes Was to pt encouraged to call back with questions or concerns? Yes

## 2021-12-17 LAB — URINE CULTURE: Culture: 80000 — AB

## 2021-12-18 ENCOUNTER — Telehealth: Payer: Self-pay | Admitting: *Deleted

## 2021-12-18 NOTE — Telephone Encounter (Signed)
Post ED Visit - Positive Culture Follow-up  Culture report reviewed by antimicrobial stewardship pharmacist: [facility] []  , Pharm.D. []  Enzo Bi, Pharm.D., BCPS AQ-ID []  , Pharm.D., BCPS []  Celedonio Miyamoto, Pharm.D., BCPS []  [place], Garvin Fila.D., BCPS, AAHIVP []  , Pharm.D., BCPS, AAHIVP []  Georgina Pillion, PharmD, BCPS []  , PharmD, BCPS []  [place], PharmD, BCPS []  [address], PharmD []  , PharmD, BCPS []  Estella Husk, PharmD  Pharmacy Team []  Lysle Pearl, PharmD []  , PharmD []  Phillips Climes, PharmD []  , Rph []  Agapito Games) , PharmD []  Verlan Friends, PharmD []  , PharmD []  Mervyn Gay, PharmD []  , PharmD []  Vinnie Level, PharmD []  [facility], PharmD []  , PharmD []  Len Childs, PharmD   Positive urine culture Treated with Nitrofurantoin Momohyd Macro, organism sensitive to the same and no further patient follow-up is required at this time.  , PharmD  Greer Pickerel Talley 12/18/2021, 9:18 AM

## 2021-12-30 ENCOUNTER — Encounter: Payer: Self-pay | Admitting: *Deleted

## 2022-05-07 ENCOUNTER — Emergency Department (HOSPITAL_COMMUNITY)
Admission: EM | Admit: 2022-05-07 | Discharge: 2022-05-08 | Disposition: A | Discharge status: Home / Self Care | Payer: Medicaid Other | Attending: Emergency Medicine | Admitting: Emergency Medicine

## 2022-05-07 DIAGNOSIS — H1033 Unspecified acute conjunctivitis, bilateral: Secondary | ICD-10-CM | POA: Diagnosis not present

## 2022-05-07 DIAGNOSIS — B9689 Other specified bacterial agents as the cause of diseases classified elsewhere: Secondary | ICD-10-CM | POA: Diagnosis not present

## 2022-05-07 DIAGNOSIS — H5789 Other specified disorders of eye and adnexa: Secondary | ICD-10-CM | POA: Diagnosis present

## 2022-05-07 NOTE — ED Notes (Signed)
Called pt for triage, no response 

## 2022-05-08 ENCOUNTER — Other Ambulatory Visit: Payer: Self-pay

## 2022-05-08 ENCOUNTER — Encounter (HOSPITAL_COMMUNITY): Payer: Self-pay | Admitting: Emergency Medicine

## 2022-05-08 MED ORDER — POLYMYXIN B-TRIMETHOPRIM 10000-0.1 UNIT/ML-% OP SOLN: 1.0000 [drp] | OPHTHALMIC | 0 refills | Status: DC

## 2022-05-08 NOTE — ED Provider Notes (Signed)
[facility] EMERGENCY DEPARTMENT Provider Note   CSN: [id number] Arrival date & time: 05/07/22  2313     History  Chief Complaint  Patient presents with   Conjunctivitis    Sara Kelly is a 26 y.o. female.  The history is provided by the patient and medical records.  Conjunctivitis   26 year old female presenting to the ED with bilateral eye redness and drainage.  Reports symptoms began in left eye 2 days ago, right eye began with similar yesterday.  States her daughter has also recently been sick with pinkeye.  She did use over-the-counter eyedrops without change.  She does not wear glasses or corrective lenses.  She denies any visual disturbance.   Home Medications Prior to Admission medications   Medication Sig Start Date End Date Taking? Authorizing Provider  trimethoprim-polymyxin b (POLYTRIM) ophthalmic solution Place 1 drop into both eyes every 4 (four) hours. 05/08/22  Yes Larene Pickett, PA-C  ferrous sulfate 325 (65 FE) MG EC tablet Take 1 tablet (325 mg total) by mouth every other day. 06/04/21   Alcus Dad, MD  nitrofurantoin, macrocrystal-monohydrate, (MACROBID) 100 MG capsule Take 1 capsule (100 mg total) by mouth 2 (two) times daily. 12/15/21   Wilnette Kales, PA      Allergies    Amoxicillin and Penicillins    Review of Systems   Review of Systems  Eyes:  Positive for redness.  All other systems reviewed and are negative.   Physical Exam Updated Vital Signs BP 109/60   Pulse 74   Temp 98.5 F (36.9 C)   Resp 19   LMP 04/29/2022   SpO2 99%   Physical Exam Vitals and nursing note reviewed.  Constitutional:      Appearance: She is well-developed.  HENT:     Head: Normocephalic and atraumatic.  Eyes:     Conjunctiva/sclera: Conjunctivae normal.     Pupils: Pupils are equal, round, and reactive to light.     Comments: No lid edema or erythema, bilateral conjunctive are mildly injected without focal hemorrhage, no  foreign body, purulent drainage noted in the medial and lateral canthi bilaterally, some crusting along the lash line, EOMs intact without pain  Cardiovascular:     Rate and Rhythm: Normal rate and regular rhythm.     Heart sounds: Normal heart sounds.  Pulmonary:     Effort: Pulmonary effort is normal.     Breath sounds: Normal breath sounds.  Abdominal:     General: Bowel sounds are normal.     Palpations: Abdomen is soft.  Musculoskeletal:        General: Normal range of motion.     Cervical back: Normal range of motion.  Skin:    General: Skin is warm and dry.  Neurological:     Mental Status: She is alert and oriented to person, place, and time.     ED Results / Procedures / Treatments   Labs (all labs ordered are listed, but only abnormal results are displayed) Labs Reviewed - No data to display  EKG None  Radiology No results found.  Procedures Procedures    Medications Ordered in ED Medications - No data to display  ED Course/ Medical Decision Making/ A&P                           Medical Decision Making Risk Prescription drug management.   26 year old female here with bilateral eye redness and drainage.  Daughter with similar.  She has no lid edema or erythema on exam.  EOMs intact without pain.  Does have some purulent drainage and crusting along the lash line.  No findings concerning for preseptal or orbital cellulitis.  Appears most consistent with conjunctivitis.  Will start on Polytrim drops and have her follow-up with PCP.  She can return here for any new or acute changes.  Final Clinical Impression(s) / ED Diagnoses Final diagnoses:  Acute bacterial conjunctivitis of both eyes    Rx / DC Orders ED Discharge Orders          Ordered    trimethoprim-polymyxin b (POLYTRIM) ophthalmic solution  Every 4 hours        05/08/22 0035              Larene Pickett, PA-C 05/08/22 0037    Margette Fast, MD 05/08/22 [id number]

## 2022-05-08 NOTE — ED Triage Notes (Signed)
Patient reports bilateral eyes redness with redness onset 2 days ago , denies injury/no vision loss.

## 2022-05-12 ENCOUNTER — Telehealth: Payer: Self-pay | Admitting: Licensed Clinical Social Worker

## 2022-05-12 NOTE — Patient Outreach (Signed)
  Care Coordination [facility] Note Transition Care Management Unsuccessful Follow-up Telephone Call  Date of discharge and from where:  05/07/22 from [facility]  Attempts:  1st Attempt  Reason for unsuccessful TCM follow-up call:  Left voice message  Eula Fried, BSW, MSW, [employer] [email or web address] Phone: [phone]

## 2022-07-09 NOTE — Progress Notes (Deleted)
    SUBJECTIVE:   CHIEF COMPLAINT / HPI:  No chief complaint on file.   ***  PERTINENT  PMH / PSH: ***  Patient Care Team: Evelena Leyden, DO as PCP - General (Family Medicine)   OBJECTIVE:   There were no vitals taken for this visit.  Physical Exam      06/03/2021    8:39 AM  Depression screen PHQ 2/9  Decreased Interest 0  Down, Depressed, Hopeless 0  PHQ - 2 Score 0  Altered sleeping 0  Tired, decreased energy 0  Change in appetite 0  Feeling bad or failure about yourself  0  Trouble concentrating 0  Moving slowly or fidgety/restless 0  Suicidal thoughts 0  PHQ-9 Score 0     {Show previous vital signs (optional):23777}  {Labs  Heme  Chem  Endocrine  Serology  Results Review (optional):23779}  ASSESSMENT/PLAN:   No problem-specific Assessment & Plan notes found for this encounter.   HCM - chlamydia screening  No follow-ups on file.   Littie Deeds, MD [facility]

## 2022-07-09 NOTE — Patient Instructions (Incomplete)
It was nice seeing you today!  Blood work today.  See me in 3 months or whenever is a good for you.  Stay well, Wissam Resor, MD [facility] [phone]  --  Make sure to check out at the front desk before you leave today.  Please arrive at least 15 minutes prior to your scheduled appointments.  If you had blood work today, I will send you a MyChart message or a letter if results are normal. Otherwise, I will give you a call.  If you had a referral placed, they will call you to set up an appointment. Please give us a call if you don't hear back in the next 2 weeks.  If you need additional refills before your next appointment, please call your pharmacy first.  

## 2022-07-10 ENCOUNTER — Ambulatory Visit (INDEPENDENT_AMBULATORY_CARE_PROVIDER_SITE_OTHER): Payer: Medicaid Other | Admitting: Family Medicine

## 2022-07-10 VITALS — BP 100/60 | HR 98 | Ht 64.0 in | Wt 137.0 lb

## 2022-07-10 DIAGNOSIS — Z0000 Encounter for general adult medical examination without abnormal findings: Secondary | ICD-10-CM

## 2022-07-10 DIAGNOSIS — Z23 Encounter for immunization: Secondary | ICD-10-CM

## 2022-07-10 DIAGNOSIS — N898 Other specified noninflammatory disorders of vagina: Secondary | ICD-10-CM | POA: Diagnosis not present

## 2022-07-10 NOTE — Patient Instructions (Signed)
Your flu vaccine was given today. If you become sexually active I recommend using condoms and letting our office know if you want to start birth control any time in the near future.    The discharge you are having right now sounds like it is natural discharge, especially since you haven't been sexually active recently. If the discharge starts to have an odor or changes in color/consistency (clumpy white, or a gray/green discharge) then let me know and we can set you up for STD testing.

## 2022-07-10 NOTE — Progress Notes (Signed)
    SUBJECTIVE:   Chief compliant/HPI: annual examination  Sara Kelly is a 26 y.o. who presents today for an annual exam. She has no acute concerns and is doing well.  Review of systems form notable for some clear vaginal discharge without odor.   Updated history tabs and problem list.   OBJECTIVE:   BP 100/60   Pulse 98   Ht _0  (1.626 m)   Wt 137 lb (62.1 kg)   LMP 06/28/2022   SpO2 100%   BMI 23.52 kg/m   General: NAD, well-appearing, well-nourished Respiratory: No respiratory distress, breathing comfortably, able to speak in full sentences Skin: warm and dry, no rashes noted on exposed skin Psych: Appropriate affect and mood  ASSESSMENT/PLAN:   Vaginal discharge Patient's reported vaginal discharge doesn't seem infectious at this time and sounds more like a natural discharge. Patient is not currently sexually active and doesn't have any odor at this time. Discussed with patient and we will plan to defer STD and vaginal testing unless the discharge changes in consistency/color and/or odor.    Annual Examination  See AVS for age appropriate recommendations.   PHQ score 0, reviewed and discussed. Blood pressure reviewed and at goal.  Asked about intimate partner violence and patient reports none.  The patient currently uses nothing for contraception. Folate recommended as appropriate, minimum of 400 mcg per day.   Considered the following items based upon USPSTF recommendations: HIV testing:  previously done, deferred today Hepatitis C:  previously done Hepatitis B:  previously done Syphilis if at high risk:  not at high risk GC/CT not at high risk and not ordered. Lipid panel (nonfasting or fasting) discussed based upon AHA recommendations and not ordered.  Consider repeat every 4-6 years.  Reviewed risk factors for latent tuberculosis and not indicated  Discussed family history, BRCA testing not indicated. Tool used to risk stratify was Pedigree Assessment  tool  Cervical cancer screening: prior Pap reviewed, repeat due in 2025 Immunizations - flu vaccine today   Follow up in 1 year or sooner if indicated.    Rise Patience, [place]

## 2022-09-12 ENCOUNTER — Emergency Department (HOSPITAL_COMMUNITY)
Admission: EM | Admit: 2022-09-12 | Discharge: 2022-09-12 | Disposition: A | Discharge status: Home / Self Care | Payer: Medicaid Other | Attending: Emergency Medicine | Admitting: Emergency Medicine

## 2022-09-12 ENCOUNTER — Other Ambulatory Visit: Payer: Self-pay

## 2022-09-12 DIAGNOSIS — S6992XA Unspecified injury of left wrist, hand and finger(s), initial encounter: Secondary | ICD-10-CM | POA: Diagnosis present

## 2022-09-12 DIAGNOSIS — W268XXA Contact with other sharp object(s), not elsewhere classified, initial encounter: Secondary | ICD-10-CM | POA: Diagnosis not present

## 2022-09-12 DIAGNOSIS — S61217A Laceration without foreign body of left little finger without damage to nail, initial encounter: Secondary | ICD-10-CM | POA: Diagnosis not present

## 2022-09-12 NOTE — ED Provider Triage Note (Signed)
Emergency Medicine Provider Triage Evaluation Note  Sara Kelly , a 27 y.o. female  was evaluated in triage.  Pt complains of finger laceration that occurred just PTA while cooking breakfast.  States she accidentally cut her left 5th digit with a knife.  Tetanus UTD (given 2022).  Review of Systems  Positive: laceration Negative: Numbness/tingling  Physical Exam  BP (!) 158/135   Pulse 94   Temp 98.8 F (37.1 C) (Oral)   Resp 18   SpO2 96%  Gen:   Awake, no distress   Resp:  Normal effort  MSK:   Moves extremities without difficulty  Other:  Laceration at base of left 5th digit, palmar aspect, slight bleeding but nothing pulsatile, able to flex/extend, no apparent tendon injury  Medical Decision Making  Medically screening exam initiated at 3:13 AM.  Appropriate orders placed.  Sara Kelly was informed that the remainder of the evaluation will be completed by another provider, this initial triage assessment does not replace that evaluation, and the importance of remaining in the ED until their evaluation is complete.  Laceration.  Tetanus UTD. Will need repair.   Larene Pickett, PA-C 09/12/22 [phone]

## 2022-09-12 NOTE — ED Triage Notes (Signed)
Patient accidentally sliced her left proximal 5th finger with a knife this evening while cutting bagel . Dressing applied at triage .

## 2022-09-12 NOTE — ED Provider Notes (Signed)
[facility] Emergency Department Provider Note MRN:  [id number]  Arrival date & time: 09/12/22     Chief Complaint   Laceration   History of Present Illness   Sara Kelly is a 27 y.o. year-old female presents to the ED with chief complaint of finger laceration.  States that she was slicing bagels at work this morning.  Bleeding controlled with gauze.  Denies difficulty moving the finger.  History provided by patient.   Review of Systems  Pertinent positive and negative review of systems noted in HPI.    Physical Exam   Vitals:   09/12/22 0312 09/12/22 0617  BP: (!) 158/135 (!) 145/78  Pulse: 94 90  Resp: 18 16  Temp: 98.8 F (37.1 C) 98.8 F (37.1 C)  SpO2: 96% 99%    CONSTITUTIONAL:  well-appearing, NAD NEURO:  Alert and oriented x 3, CN 3-12 grossly intact EYES:  eyes equal and reactive ENT/NECK:  Supple, no stridor  CARDIO:  appears well-perfused  PULM:  No respiratory distress,  GI/GU:  non-distended,  MSK/SPINE:  No gross deformities, no edema, moves all extremities, normal strength of left 5th finger isolated at all joints. SKIN:  shallow 1 cm laceration to the proximal left 5th finger on the palmar aspect without tendon involvement or FB   *Additional and/or pertinent findings included in MDM below  Diagnostic and Interventional Summary    EKG Interpretation  Date/Time:    Ventricular Rate:    PR Interval:    QRS Duration:   QT Interval:    QTC Calculation:   R Axis:     Text Interpretation:         Labs Reviewed - No data to display  No orders to display    Medications - No data to display   Procedures  /  Critical Care .Marland KitchenLaceration Repair  Date/Time: 09/12/2022 6:21 AM  Performed by: Montine Circle, PA-C Authorized by: Montine Circle, PA-C   Consent:    Consent obtained:  Verbal   Consent given by:  Patient   Risks, benefits, and alternatives were discussed: yes     Risks discussed:  Poor wound  healing, poor cosmetic result and pain   Alternatives discussed:  No treatment Universal protocol:    Procedure explained and questions answered to patient or proxy's satisfaction: yes     Relevant documents present and verified: yes     Test results available: yes     Imaging studies available: yes     Required blood products, implants, devices, and special equipment available: yes     Site/side marked: yes     Immediately prior to procedure, a time out was called: yes     Patient identity confirmed:  Verbally with patient Anesthesia:    Anesthesia method:  None Laceration details:    Location:  Finger   Finger location:  L small finger   Length (cm):  1 Treatment:    Area cleansed with:  Povidone-iodine and saline   Amount of cleaning:  Standard   Irrigation solution:  Sterile saline Skin repair:    Repair method:  Tissue adhesive Approximation:    Approximation:  Close Repair type:    Repair type:  Simple Post-procedure details:    Dressing:  Non-adherent dressing and splint for protection   Procedure completion:  Tolerated   ED Course and Medical Decision Making  I have reviewed the triage vital signs, the nursing notes, and pertinent available records from the EMR.  Social  Determinants Affecting Complexity of Care: Patient has no clinically significant social determinants affecting this chief complaint..   ED Course:    Medical Decision Making Patient with small skin lac.  Shallow.  No FB or tendon injury.  Repaired with dermabond.     Consultants: No consultations were needed in caring for this patient.   Treatment and Plan: Emergency department workup does not suggest an emergent condition requiring admission or immediate intervention beyond  what has been performed at this time. The patient is safe for discharge and has  been instructed to return immediately for worsening symptoms, change in  symptoms or any other concerns    Final Clinical  Impressions(s) / ED Diagnoses     ICD-10-CM   1. Laceration of left little finger without foreign body without damage to nail, initial encounter  [id number]       ED Discharge Orders     None         Discharge Instructions Discussed with and Provided to Patient:   Discharge Instructions   None      Montine Circle, PA-C 09/12/22 [id number]    Ripley Fraise, MD 09/12/22 [phone]

## 2022-10-29 ENCOUNTER — Ambulatory Visit: Payer: Self-pay | Admitting: Family Medicine

## 2023-01-08 ENCOUNTER — Other Ambulatory Visit (HOSPITAL_COMMUNITY)
Admission: RE | Admit: 2023-01-08 | Discharge: 2023-01-08 | Disposition: A | Discharge status: Home / Self Care | Payer: Medicaid Other | Source: Ambulatory Visit | Attending: Family Medicine | Admitting: Family Medicine

## 2023-01-08 ENCOUNTER — Ambulatory Visit (INDEPENDENT_AMBULATORY_CARE_PROVIDER_SITE_OTHER): Payer: Medicaid Other | Admitting: Family Medicine

## 2023-01-08 ENCOUNTER — Encounter: Payer: Self-pay | Admitting: Family Medicine

## 2023-01-08 VITALS — BP 119/75 | HR 80 | Ht 64.0 in | Wt 133.6 lb

## 2023-01-08 DIAGNOSIS — Z202 Contact with and (suspected) exposure to infections with a predominantly sexual mode of transmission: Secondary | ICD-10-CM

## 2023-01-08 DIAGNOSIS — N760 Acute vaginitis: Secondary | ICD-10-CM | POA: Diagnosis present

## 2023-01-08 DIAGNOSIS — N898 Other specified noninflammatory disorders of vagina: Secondary | ICD-10-CM | POA: Insufficient documentation

## 2023-01-08 DIAGNOSIS — B9689 Other specified bacterial agents as the cause of diseases classified elsewhere: Secondary | ICD-10-CM | POA: Diagnosis not present

## 2023-01-08 LAB — POCT WET PREP (WET MOUNT)
Clue Cells Wet Prep Whiff POC: POSITIVE
Trichomonas Wet Prep HPF POC: ABSENT

## 2023-01-08 MED ORDER — FLUCONAZOLE 150 MG PO TABS: 150.0000 mg | ORAL_TABLET | Freq: Once | ORAL | 0 refills | Status: AC

## 2023-01-08 MED ORDER — METRONIDAZOLE 500 MG PO TABS: 500.0000 mg | ORAL_TABLET | Freq: Two times a day (BID) | ORAL | 0 refills | Status: AC

## 2023-01-08 NOTE — Patient Instructions (Addendum)
You have been scheduled for the IUD insertion on 6/24 at 9:30 AM.  Please make sure to arrive on time, if you change your mind about getting this place and please call the office and cancel the appointment.  Please use condoms in the meantime until the IUD is placed.  It appears they have yeast on your exam, I went ahead and sent in for Diflucan, which is a one-time pill.  If you are still having discharge 3 days after your first pill you can take a second dose.  I will let you know if there is anything else that turns out positive on your testing.

## 2023-01-08 NOTE — Progress Notes (Signed)
    SUBJECTIVE:   CHIEF COMPLAINT / HPI:   Birth control counseling - Previously did Depo but had a lot of bleeding and does not desire to skin - Nexplanon previously completed but also had bleeding/spotting  - Was prescribed the NuvaRing previously but never actually used - Does not feel that she would be consistent with taking pills - Last unprotected intercourse was 2 days ago  Vaginal discharge - started a few days ago - Reports having some increased vaginal discharge but no odor   PERTINENT  PMH / PSH: Reviewed  OBJECTIVE:   BP 119/75   Pulse 80   Ht 5\' 4"  (1.626 m)   Wt 133 lb 9.6 oz (60.6 kg)   LMP 12/31/2022   SpO2 100%   BMI 22.93 kg/m   General: NAD, well-appearing, well-nourished Respiratory: No respiratory distress, breathing comfortably, able to speak in full sentences Skin: warm and dry, no rashes noted on exposed skin Psych: Appropriate affect and mood Pelvic exam: VULVA: normal appearing vulva with no masses, tenderness or lesions, VAGINA: vaginal discharge - white, copious, and curd-like, WET MOUNT done - results: clue cells, excessive bacteria, exam chaperoned by Cleatrice Burke, CMA.  ASSESSMENT/PLAN:   Vaginal Discharge Present for the last few days. Appears consistent with yeast on examination. Wet prep was done which showed BV was present, will send for the antibiotics as well as follow-up Diflucan. - Metronidazole 500mg  BID x7 days - Diflucan 150mg  x1 dose, repeat on day 3 if continued discharge - Gonorrhea/chlamydia testing sent  Contraception Management Discussed at length patient's different options.  As patient had bleeding issues with the Nexplanon and Depo, she does not wish to pursue this again.  Discussed NuvaRing and pills, but patient states she would be unlikely to actually use these.  Ultimately decided that we will proceed with IUD placement (Mirena). - IUD placement scheduled for 6/24 - Counseled patient regarding condom use in the  interim - Will need pregnancy test before IUD placement   Sara Leyden, DO [facility]

## 2023-01-11 LAB — CERVICOVAGINAL ANCILLARY ONLY
Chlamydia: NEGATIVE
Comment: NEGATIVE
Comment: NEGATIVE
Comment: NORMAL
Neisseria Gonorrhea: NEGATIVE
Trichomonas: NEGATIVE

## 2023-01-18 ENCOUNTER — Ambulatory Visit: Payer: Medicaid Other | Admitting: Student

## 2023-03-04 ENCOUNTER — Ambulatory Visit (HOSPITAL_COMMUNITY)
Admission: EM | Admit: 2023-03-04 | Discharge: 2023-03-04 | Disposition: A | Discharge status: Home / Self Care | Payer: Medicaid Other | Attending: Family Medicine | Admitting: Family Medicine

## 2023-03-04 ENCOUNTER — Ambulatory Visit (HOSPITAL_COMMUNITY): Payer: Medicaid Other

## 2023-03-04 ENCOUNTER — Encounter (HOSPITAL_COMMUNITY): Payer: Self-pay

## 2023-03-04 DIAGNOSIS — J069 Acute upper respiratory infection, unspecified: Secondary | ICD-10-CM | POA: Diagnosis not present

## 2023-03-04 DIAGNOSIS — R509 Fever, unspecified: Secondary | ICD-10-CM | POA: Diagnosis present

## 2023-03-04 DIAGNOSIS — U071 COVID-19: Secondary | ICD-10-CM | POA: Diagnosis not present

## 2023-03-04 DIAGNOSIS — R059 Cough, unspecified: Secondary | ICD-10-CM | POA: Diagnosis present

## 2023-03-04 NOTE — ED Triage Notes (Signed)
Pt reports she had chills, cough and fever x 4 days

## 2023-03-04 NOTE — ED Provider Notes (Signed)
[facility]    CSN: [id number] Arrival date & time: 03/04/23  1906      History   Chief Complaint No chief complaint on file.   HPI Sara Kelly is a 27 y.o. female.   HPI Here for chills and fever and cough.  Symptoms began with some malaise and scratchy throat on August 4.  Then the next day she began having some cough and some congestion.  The cough is improving and she does not have the malaise anymore.  Her throat is not hurting either.  No nausea vomiting or diarrhea   last menstrual cycle was July 22    Past Medical History:  Diagnosis Date   Boil of buttock    Normal labor 03/14/2017    Patient Active Problem List   Diagnosis Date Noted   Supervision of normal pregnancy 08/22/2020   Fall from mobile elevated work platform as cause of accidental injury 08/08/2020   Contraception management 05/10/2013   Acne comedone 07/08/2011    Past Surgical History:  Procedure Laterality Date   NO PAST SURGERIES      OB History     Gravida  2   Para  2   Term  2   Preterm      AB      Living  2      SAB      IAB      Ectopic      Multiple  0   Live Births  2            Home Medications    Prior to Admission medications   Medication Sig Start Date End Date Taking? Authorizing Provider  ferrous sulfate 325 (65 FE) MG EC tablet Take 1 tablet (325 mg total) by mouth every other day. 06/04/21   Maury Dus, MD    Family History Family History  Problem Relation Age of Onset   Healthy Mother    Healthy Father    Diabetes Sister     Social History Social History   Tobacco Use   Smoking status: Former    Types: Cigars    Quit date: 07/27/2016    Years since quitting: 6.6   Smokeless tobacco: Never  Substance Use Topics   Alcohol use: No    Comment: quit with +preg   Drug use: Yes    Types: Marijuana    Comment: Stopped once pregnant     Allergies   Amoxicillin and Penicillins   Review of Systems Review of  Systems   Physical Exam Triage Vital Signs ED Triage Vitals  Encounter Vitals Group     BP 03/04/23 1947 111/72     Systolic BP Percentile --      Diastolic BP Percentile --      Pulse Rate 03/04/23 1947 71     Resp 03/04/23 1947 20     Temp 03/04/23 1947 98.2 F (36.8 C)     Temp Source 03/04/23 1947 Oral     SpO2 03/04/23 1947 98 %     Weight --      Height --      Head Circumference --      Peak Flow --      Pain Score 03/04/23 1949 0     Pain Loc --      Pain Education --      Exclude from Growth Chart --    No data found.  Updated Vital Signs BP 111/72 (BP  Location: Left Arm)   Pulse 71   Temp 98.2 F (36.8 C) (Oral)   Resp 20   LMP 02/15/2023 Comment: start  SpO2 98%   Visual Acuity Right Eye Distance:   Left Eye Distance:   Bilateral Distance:    Right Eye Near:   Left Eye Near:    Bilateral Near:     Physical Exam Vitals reviewed.  Constitutional:      General: She is not in acute distress.    Appearance: She is not toxic-appearing.  HENT:     Right Ear: Tympanic membrane and ear canal normal.     Left Ear: Tympanic membrane and ear canal normal.     Nose: Nose normal.     Mouth/Throat:     Mouth: Mucous membranes are moist.     Comments: There is some clear mucus draining in the oropharynx.  No erythema Eyes:     Extraocular Movements: Extraocular movements intact.     Conjunctiva/sclera: Conjunctivae normal.     Pupils: Pupils are equal, round, and reactive to light.  Cardiovascular:     Rate and Rhythm: Normal rate and regular rhythm.     Heart sounds: No murmur heard. Pulmonary:     Effort: Pulmonary effort is normal. No respiratory distress.     Breath sounds: No stridor. No wheezing, rhonchi or rales.  Musculoskeletal:     Cervical back: Neck supple.  Lymphadenopathy:     Cervical: No cervical adenopathy.  Skin:    Capillary Refill: Capillary refill takes less than 2 seconds.     Coloration: Skin is not jaundiced or pale.   Neurological:     General: No focal deficit present.     Mental Status: She is alert and oriented to person, place, and time.  Psychiatric:        Behavior: Behavior normal.      UC Treatments / Results  Labs (all labs ordered are listed, but only abnormal results are displayed) Labs Reviewed  SARS CORONAVIRUS 2 (TAT 6-24 HRS)    EKG   Radiology No results found.  Procedures Procedures (including critical care time)  Medications Ordered in UC Medications - No data to display  Initial Impression / Assessment and Plan / UC Course  I have reviewed the triage vital signs and the nursing notes.  Pertinent labs & imaging results that were available during my care of the patient were reviewed by me and considered in my medical decision making (see chart for details).        COVID swab is done, so she we will know if she needs to isolate.  Work note is provided.   she will continue using Alka-Seltzer plus as needed for symptoms.   Final Clinical Impressions(s) / UC Diagnoses   Final diagnoses:  Viral URI     Discharge Instructions       You have been swabbed for COVID, and the test will result in the next 24 hours. Our staff will call you if positive. If the COVID test is positive, you should quarantine until you are fever free for 24 hours and you are starting to feel better, and then take added precautions for the next 5 days, such as physical distancing/wearing a mask and good hand hygiene/washing.      ED Prescriptions   None    PDMP not reviewed this encounter.   Zenia Resides, MD 03/04/23 2001

## 2023-03-04 NOTE — Discharge Instructions (Signed)
  You have been swabbed for COVID, and the test will result in the next 24 hours. Our staff will call you if positive. If the COVID test is positive, you should quarantine until you are fever free for 24 hours and you are starting to feel better, and then take added precautions for the next 5 days, such as physical distancing/wearing a mask and good hand hygiene/washing.

## 2023-03-08 ENCOUNTER — Ambulatory Visit: Payer: Medicaid Other | Admitting: Student

## 2023-04-19 ENCOUNTER — Ambulatory Visit: Payer: Medicaid Other | Admitting: Student

## 2023-04-19 NOTE — Progress Notes (Deleted)
    SUBJECTIVE:   CHIEF COMPLAINT / HPI: IUD Insertion  PERTINENT  PMH / PSH: ***  OBJECTIVE:   There were no vitals taken for this visit.  *** PROCEDURE NOTE: IUD INSERTION Patient given informed consent for IUD insertion of new IUD. Signed copy in the chart. Appropriate time out taken. Sterile technique used. Patient placed in the lithotomy position and the cervix brought into view using speculum. Cervix cleansed three times with  betadine swabs.  A tenaculum was placed into the anterior lip of the cervix and a uterine sound was used to measure uterine size***.  A *** IUD was placed into the endometrial cavity, deployed and secured. The applicator was removed. The strings were trimmed to 2 centimeters.  All equipment was removed and accounted for.There were no complications and the patient tolerated the procedure well.   She was given handouts for post procedure instructions and information about the IUD including a card with the time of recommended removal.   ASSESSMENT/PLAN:   No problem-specific Assessment & Plan notes found for this encounter.     Levin Erp, MD [facility]

## 2023-05-24 ENCOUNTER — Ambulatory Visit: Payer: Self-pay

## 2023-05-24 ENCOUNTER — Ambulatory Visit
Admission: EM | Admit: 2023-05-24 | Discharge: 2023-05-24 | Disposition: A | Discharge status: Home / Self Care | Payer: Medicaid Other | Attending: Internal Medicine | Admitting: Internal Medicine

## 2023-05-24 DIAGNOSIS — K047 Periapical abscess without sinus: Secondary | ICD-10-CM

## 2023-05-24 MED ORDER — CLINDAMYCIN HCL 300 MG PO CAPS: 300.0000 mg | ORAL_CAPSULE | Freq: Three times a day (TID) | ORAL | 0 refills | Status: AC

## 2023-05-24 NOTE — Discharge Instructions (Addendum)
Start clindamycin 3 times a day for 7 days.  He may use over-the-counter Tylenol or ibuprofen as needed.  Please follow-up with a dentist ASAP for further treatment of your symptoms.  Please go to the emergency room if you develop any worsening symptoms.  This includes but is not limited to fevers, worsening dental pain, worsening facial swelling, or any new concerns that arise.  I hope you feel better soon!

## 2023-05-24 NOTE — ED Provider Notes (Signed)
[facility]    CSN: [id number] Arrival date & time: 05/24/23  7829      History   Chief Complaint No chief complaint on file.   HPI Sara Kelly is a 27 y.o. female presents for dental infection.  Patient reports about a week ago she broke her left upper molar while eating.  Couple days ago she woke up with some facial swelling and tenderness along the gumline.  Denies any fevers, drainage from the tooth,, difficulty breathing or swallowing, tongue or lip swelling.  She reports a history of dental infections in the past.  She has been taking Goody powder and cool compresses for symptoms.  No other concerns at this time  HPI  Past Medical History:  Diagnosis Date   Boil of buttock    Normal labor 03/14/2017    Patient Active Problem List   Diagnosis Date Noted   Supervision of normal pregnancy 08/22/2020   Fall from mobile elevated work platform as cause of accidental injury 08/08/2020   Contraception management 05/10/2013   Acne comedone 07/08/2011    Past Surgical History:  Procedure Laterality Date   NO PAST SURGERIES      OB History     Gravida  2   Para  2   Term  2   Preterm      AB      Living  2      SAB      IAB      Ectopic      Multiple  0   Live Births  2            Home Medications    Prior to Admission medications   Medication Sig Start Date End Date Taking? Authorizing Provider  clindamycin (CLEOCIN) 300 MG capsule Take 1 capsule (300 mg total) by mouth 3 (three) times daily for 7 days. 05/24/23 05/31/23 Yes Radford Pax, NP  ferrous sulfate 325 (65 FE) MG EC tablet Take 1 tablet (325 mg total) by mouth every other day. 06/04/21   Maury Dus, MD    Family History Family History  Problem Relation Age of Onset   Healthy Mother    Healthy Father    Diabetes Sister     Social History Social History   Tobacco Use   Smoking status: Some Days    Types: Cigars    Last attempt to quit: 07/27/2016    Years  since quitting: 6.8   Smokeless tobacco: Never  Substance Use Topics   Alcohol use: No    Comment: quit with +preg   Drug use: Yes    Types: Marijuana    Comment: Stopped once pregnant     Allergies   Amoxicillin, Penicillins, and Vinegar [acetic acid]   Review of Systems Review of Systems  HENT:  Positive for dental problem.      Physical Exam Triage Vital Signs ED Triage Vitals  Encounter Vitals Group     BP 05/24/23 0950 104/70     Systolic BP Percentile --      Diastolic BP Percentile --      Pulse Rate 05/24/23 0950 78     Resp 05/24/23 0950 16     Temp 05/24/23 0950 98.4 F (36.9 C)     Temp Source 05/24/23 0950 Oral     SpO2 05/24/23 0950 98 %     Weight --      Height --      Head Circumference --  Peak Flow --      Pain Score 05/24/23 0948 0     Pain Loc --      Pain Education --      Exclude from Growth Chart --    No data found.  Updated Vital Signs BP 104/70 (BP Location: Left Arm)   Pulse 78   Temp 98.4 F (36.9 C) (Oral)   Resp 16   SpO2 98%   Visual Acuity Right Eye Distance:   Left Eye Distance:   Bilateral Distance:    Right Eye Near:   Left Eye Near:    Bilateral Near:     Physical Exam Vitals and nursing note reviewed.  Constitutional:      General: She is not in acute distress.    Appearance: Normal appearance. She is not ill-appearing.  HENT:     Head: Normocephalic and atraumatic.      Comments: Very minimal facial swelling of left upper cheek.  No tongue/lip/throat swelling.    Mouth/Throat:     Mouth: Mucous membranes are moist.     Pharynx: Oropharynx is clear. Uvula midline. No uvula swelling.      Comments: Tooth #16 is partially broken with mild swelling without palpable abscess or drainage.  Eyes:     Pupils: Pupils are equal, round, and reactive to light.  Cardiovascular:     Rate and Rhythm: Normal rate.  Pulmonary:     Effort: Pulmonary effort is normal.  Skin:    General: Skin is warm and dry.   Neurological:     General: No focal deficit present.     Mental Status: She is alert and oriented to person, place, and time.  Psychiatric:        Mood and Affect: Mood normal.        Behavior: Behavior normal.      UC Treatments / Results  Labs (all labs ordered are listed, but only abnormal results are displayed) Labs Reviewed - No data to display  EKG   Radiology No results found.  Procedures Procedures (including critical care time)  Medications Ordered in UC Medications - No data to display  Initial Impression / Assessment and Plan / UC Course  I have reviewed the triage vital signs and the nursing notes.  Pertinent labs & imaging results that were available during my care of the patient were reviewed by me and considered in my medical decision making (see chart for details).     Reviewed exam and symptoms with patient.  No red flags.  She is well-appearing in clinic and afebrile.  Patient has allergy to penicillin will start clindamycin for 7 days.  Advised OTC probiotic.  Patient chart reveals breast-feeding but she states she is no longer breast-feeding.  Denies pregnancy.  Discussed with patient she will need to follow-up with a dentist ASAP for further treatment of her symptoms as antibiotics are only a temporary solution and she verbalized understanding.  Strict ER precautions reviewed and patient verbalized understanding. Final Clinical Impressions(s) / UC Diagnoses   Final diagnoses:  Dental infection     Discharge Instructions      Start clindamycin 3 times a day for 7 days.  He may use over-the-counter Tylenol or ibuprofen as needed.  Please follow-up with a dentist ASAP for further treatment of your symptoms.  Please go to the emergency room if you develop any worsening symptoms.  This includes but is not limited to fevers, worsening dental pain, worsening facial swelling, or any  new concerns that arise.  I hope you feel better soon!    ED  Prescriptions     Medication Sig Dispense Auth. Provider   clindamycin (CLEOCIN) 300 MG capsule Take 1 capsule (300 mg total) by mouth 3 (three) times daily for 7 days. 21 capsule Radford Pax, NP      PDMP not reviewed this encounter.   Radford Pax, NP 05/24/23 1008

## 2023-05-24 NOTE — ED Triage Notes (Signed)
Pt presents to UC w/ c/o facial swelling x2 days. Pt states she "woke up and it was swollen." Pt denies pain at this time, but states 2 days ago she "had pain when talking." Does not have a dentist Home remedies: Tylenol, goody powder

## 2023-08-31 ENCOUNTER — Ambulatory Visit
Admission: EM | Admit: 2023-08-31 | Discharge: 2023-08-31 | Disposition: A | Discharge status: Home / Self Care | Payer: Medicaid Other | Attending: Family Medicine | Admitting: Family Medicine

## 2023-08-31 DIAGNOSIS — R051 Acute cough: Secondary | ICD-10-CM | POA: Insufficient documentation

## 2023-08-31 DIAGNOSIS — B349 Viral infection, unspecified: Secondary | ICD-10-CM | POA: Diagnosis not present

## 2023-08-31 NOTE — Discharge Instructions (Signed)
 The clinic will contact you with results of the COVID test done today if positive.  Continue over-the-counter cough medicine as needed.  Lots of rest and fluids.  Please follow-up with your PCP in 2 to 3 days for recheck.  Please go to the ER for any worsening symptoms.  I hope you feel better soon!

## 2023-08-31 NOTE — ED Provider Notes (Signed)
 [facility]    CSN: [id number] Arrival date & time: 08/31/23  0918      History   Chief Complaint Chief Complaint  Patient presents with   Fatigue    HPI Sara Kelly is a 28 y.o. female  presents for evaluation of URI symptoms for 4 days. Patient reports associated symptoms of cough, congestion, sore throat, decreased appetite, fatigue. Denies N/V/D, fevers, body aches, shortness of breath. Patient does not have a hx of asthma. Patient is an active smoker.   Reports sick contacts via work.  Pt has taken Sudafed and Alka-Seltzer OTC for symptoms. Pt has no other concerns at this time.   HPI  Past Medical History:  Diagnosis Date   Boil of buttock    Normal labor 03/14/2017    Patient Active Problem List   Diagnosis Date Noted   Supervision of normal pregnancy 08/22/2020   Fall from mobile elevated work platform as cause of accidental injury 08/08/2020   Contraception management 05/10/2013   Acne comedone 07/08/2011    Past Surgical History:  Procedure Laterality Date   NO PAST SURGERIES      OB History     Gravida  2   Para  2   Term  2   Preterm      AB      Living  2      SAB      IAB      Ectopic      Multiple  0   Live Births  2            Home Medications    Prior to Admission medications   Medication Sig Start Date End Date Taking? Authorizing Provider  ferrous sulfate  325 (65 FE) MG EC tablet Take 1 tablet (325 mg total) by mouth every other day. 06/04/21   Malvina Ellen, MD    Family History Family History  Problem Relation Age of Onset   Healthy Mother    Healthy Father    Diabetes Sister     Social History Social History   Tobacco Use   Smoking status: Some Days    Types: Cigars    Last attempt to quit: 07/27/2016    Years since quitting: 7.0   Smokeless tobacco: Never  Substance Use Topics   Alcohol use: No    Comment: quit with +preg   Drug use: Yes    Types: Marijuana    Comment: Stopped once  pregnant     Allergies   Amoxicillin, Penicillins, and Vinegar [acetic acid]   Review of Systems Review of Systems  HENT:  Positive for congestion and sore throat.   Respiratory:  Positive for cough.      Physical Exam Triage Vital Signs ED Triage Vitals  Encounter Vitals Group     BP 08/31/23 1044 120/62     Systolic BP Percentile --      Diastolic BP Percentile --      Pulse Rate 08/31/23 1044 92     Resp 08/31/23 1044 16     Temp 08/31/23 1044 98.7 F (37.1 C)     Temp Source 08/31/23 1044 Oral     SpO2 08/31/23 1044 96 %     Weight --      Height --      Head Circumference --      Peak Flow --      Pain Score 08/31/23 1039 0     Pain Loc --  Pain Education --      Exclude from Growth Chart --    No data found.  Updated Vital Signs BP 120/62 (BP Location: Left Arm)   Pulse 92   Temp 98.7 F (37.1 C) (Oral)   Resp 16   SpO2 96%   Breastfeeding No   Visual Acuity Right Eye Distance:   Left Eye Distance:   Bilateral Distance:    Right Eye Near:   Left Eye Near:    Bilateral Near:     Physical Exam Vitals and nursing note reviewed.  Constitutional:      General: She is not in acute distress.    Appearance: She is well-developed. She is not ill-appearing.  HENT:     Head: Normocephalic and atraumatic.     Right Ear: Tympanic membrane and ear canal normal.     Left Ear: Tympanic membrane and ear canal normal.     Nose: Congestion present.     Mouth/Throat:     Mouth: Mucous membranes are moist.     Pharynx: Oropharynx is clear. Uvula midline. No oropharyngeal exudate or posterior oropharyngeal erythema.     Tonsils: No tonsillar exudate or tonsillar abscesses.  Eyes:     Conjunctiva/sclera: Conjunctivae normal.     Pupils: Pupils are equal, round, and reactive to light.  Cardiovascular:     Rate and Rhythm: Normal rate and regular rhythm.     Heart sounds: Normal heart sounds.  Pulmonary:     Effort: Pulmonary effort is normal.      Breath sounds: Normal breath sounds. No wheezing or rhonchi.  Musculoskeletal:     Cervical back: Normal range of motion and neck supple.  Lymphadenopathy:     Cervical: No cervical adenopathy.  Skin:    General: Skin is warm and dry.  Neurological:     General: No focal deficit present.     Mental Status: She is alert and oriented to person, place, and time.  Psychiatric:        Mood and Affect: Mood normal.        Behavior: Behavior normal.      UC Treatments / Results  Labs (all labs ordered are listed, but only abnormal results are displayed) Labs Reviewed  SARS CORONAVIRUS 2 (TAT 6-24 HRS)    EKG   Radiology No results found.  Procedures Procedures (including critical care time)  Medications Ordered in UC Medications - No data to display  Initial Impression / Assessment and Plan / UC Course  I have reviewed the triage vital signs and the nursing notes.  Pertinent labs & imaging results that were available during my care of the patient were reviewed by me and considered in my medical decision making (see chart for details).     Reviewed exam and symptoms with patient.  No red flags.  COVID PCR and will contact if positive.  Discussed viral illness and symptomatic treatment.  PCP follow-up 2 to 3 days for recheck.  ER precautions reviewed. Final Clinical Impressions(s) / UC Diagnoses   Final diagnoses:  Acute cough  Viral illness     Discharge Instructions      The clinic will contact you with results of the COVID test done today if positive.  Continue over-the-counter cough medicine as needed.  Lots of rest and fluids.  Please follow-up with your PCP in 2 to 3 days for recheck.  Please go to the ER for any worsening symptoms.  I hope you feel better soon!  ED Prescriptions   None    PDMP not reviewed this encounter.   Loreda Myla SAUNDERS, NP 08/31/23 [phone]

## 2023-08-31 NOTE — ED Triage Notes (Signed)
Pt presents to UC for c/o cough, fatigue, runny nose, sore throat, fever, decreased appetite, weakness x4 days. Took sudafed and alkaseltzer.

## 2023-09-01 LAB — SARS CORONAVIRUS 2 (TAT 6-24 HRS): SARS Coronavirus 2: NEGATIVE

## 2024-06-15 ENCOUNTER — Encounter: Payer: Self-pay | Admitting: Family Medicine

## 2024-06-15 ENCOUNTER — Ambulatory Visit: Payer: Self-pay | Admitting: Family Medicine

## 2024-06-15 ENCOUNTER — Other Ambulatory Visit (HOSPITAL_COMMUNITY)
Admission: RE | Admit: 2024-06-15 | Discharge: 2024-06-15 | Disposition: A | Discharge status: Home / Self Care | Source: Ambulatory Visit | Attending: Family Medicine | Admitting: Family Medicine

## 2024-06-15 ENCOUNTER — Ambulatory Visit: Admitting: Family Medicine

## 2024-06-15 VITALS — BP 116/73 | HR 98 | Ht 64.0 in | Wt 143.0 lb

## 2024-06-15 DIAGNOSIS — Z113 Encounter for screening for infections with a predominantly sexual mode of transmission: Secondary | ICD-10-CM | POA: Diagnosis not present

## 2024-06-15 DIAGNOSIS — Z124 Encounter for screening for malignant neoplasm of cervix: Secondary | ICD-10-CM | POA: Insufficient documentation

## 2024-06-15 LAB — POCT WET PREP (WET MOUNT)
Clue Cells Wet Prep Whiff POC: NEGATIVE
Trichomonas Wet Prep HPF POC: ABSENT

## 2024-06-15 MED ORDER — CLOTRIMAZOLE 1 % VA CREA: 1.0000 | TOPICAL_CREAM | Freq: Every day | VAGINAL | 0 refills | Status: AC

## 2024-06-15 NOTE — Patient Instructions (Addendum)
 We collected your pap smear today. I will call you about results of the wet prep.  Otherwise, keep up the great work, and let me know if you need me before you return for your annual physical in 2026.

## 2024-06-15 NOTE — Progress Notes (Signed)
    SUBJECTIVE:   Chief compliant/HPI: annual examination  Sara Kelly is a 28 y.o. who presents today for an annual exam. No concerns.  OBJECTIVE:   BP 116/73   Pulse 98   Ht 5' 4 (1.626 m)   Wt 143 lb (64.9 kg)   LMP 05/26/2024   SpO2 100%   BMI 24.55 kg/m   General: Alert and oriented, in NAD Skin: Warm, dry, and intact HEENT: NCAT, EOM grossly normal, midline nasal septum Cardiac: RRR, no m/r/g appreciated Respiratory: CTAB, breathing and speaking comfortably on RA Abdominal: Soft, nondistended, normoactive bowel sounds Extremities: Moves all extremities grossly equally Genitourinary: External vagina normal, vaginal vault without lesions, cervix without bleeding or lesions, moderate thick white discharge in vaginal vault Neurological: No gross focal deficit Psychiatric: Appropriate mood and affect   ASSESSMENT/PLAN:   Annual Examination  See AVS for age appropriate recommendations.   PHQ score 0, reviewed and discussed. Blood pressure reviewed and at goal.  The patient is not on contraception, but she will return to further discuss after she looks more into options.  Considered the following items based upon USPSTF recommendations: HIV testing:discussed and declined Hepatitis C: discussed and declined Hepatitis B:discussed and declined Syphilis if at high risk: discussed and declined GC/CT ordered Cervical cancer screening: due for Pap today, cytology alone ordered (HPV if ASCUS) Wet prep also collected for discharge on exam, will await results MyChart Activation:Already signed up  Follow up in 1 year or sooner if indicated.   Stuart Redo, MD [facility]

## 2024-06-20 LAB — CYTOLOGY - PAP
Chlamydia: NEGATIVE
Comment: NEGATIVE
Comment: NEGATIVE
Comment: NORMAL
Diagnosis: NEGATIVE
Neisseria Gonorrhea: NEGATIVE
Trichomonas: NEGATIVE

## 2024-07-07 ENCOUNTER — Ambulatory Visit
Admission: RE | Admit: 2024-07-07 | Discharge: 2024-07-07 | Disposition: A | Discharge status: Home / Self Care | Source: Ambulatory Visit | Attending: Family Medicine | Admitting: Family Medicine

## 2024-07-07 ENCOUNTER — Other Ambulatory Visit: Payer: Self-pay

## 2024-07-07 VITALS — BP 115/74 | HR 71 | Temp 98.3°F | Resp 17 | Ht 64.0 in | Wt 143.0 lb

## 2024-07-07 DIAGNOSIS — K0381 Cracked tooth: Secondary | ICD-10-CM | POA: Diagnosis not present

## 2024-07-07 DIAGNOSIS — K0889 Other specified disorders of teeth and supporting structures: Secondary | ICD-10-CM | POA: Diagnosis not present

## 2024-07-07 DIAGNOSIS — K047 Periapical abscess without sinus: Secondary | ICD-10-CM | POA: Diagnosis not present

## 2024-07-07 MED ORDER — DOXYCYCLINE HYCLATE 100 MG PO CAPS: 100.0000 mg | ORAL_CAPSULE | Freq: Two times a day (BID) | ORAL | 0 refills | Status: AC

## 2024-07-07 MED ORDER — OXYCODONE-ACETAMINOPHEN 5-325 MG PO TABS: 1.0000 | ORAL_TABLET | Freq: Three times a day (TID) | ORAL | 0 refills | Status: AC | PRN

## 2024-07-07 NOTE — ED Triage Notes (Signed)
 Pt presents with a chief complaint of dental problem. States she chipped her tooth (left-upper mouth) about 2-3 weeks ago. Began to have intense pain on left side of mouth about 2-3 days ago. Currently rates overall pain a 4/10. OTC Ibuprofen , Tylenol , and goody powder taken for symptoms at home with some improvement/relief. Plans to get the tooth extracted next week.

## 2024-07-07 NOTE — ED Provider Notes (Signed)
 [facility]    CSN: [id number] Arrival date & time: 07/07/24  1556      History   Chief Complaint Chief Complaint  Patient presents with   Dental Problem    HPI Sara Kelly is a 28 y.o. female.   HPI 28 year old female presents with dental problem.  Reports that she chipped her left upper tooth 2 to 3 weeks ago and began having intense pain 3 days ago.  Patient reports possible dental extract next week.  Past Medical History:  Diagnosis Date   Acne comedone 07/08/2011   Boil of buttock    Contraception management 05/10/2013   Fall from mobile elevated work platform as cause of accidental injury 08/08/2020   Normal labor 03/14/2017   Supervision of normal pregnancy 08/22/2020              Nursing Staff    Provider      Office Location    [facility]     Dating     03/29/2021 by LMP       Language     English    Anatomy US      EICF      Flu Vaccine          Genetic Screen     NIPS: Low risk  Horizon: Negative         TDaP Vaccine      01/16/21    Hgb A1C or   GTT    90, passed      COVID Vaccine     09/26/20, 10/17/20         LAB RESULTS       Rhogam          Blood Type    O/Positive/-    There are no active problems to display for this patient.   Past Surgical History:  Procedure Laterality Date   NO PAST SURGERIES      OB History     Gravida  2   Para  2   Term  2   Preterm      AB      Living  2      SAB      IAB      Ectopic      Multiple  0   Live Births  2            Home Medications    Prior to Admission medications  Medication Sig Start Date End Date Taking? Authorizing Provider  doxycycline (VIBRAMYCIN) 100 MG capsule Take 1 capsule (100 mg total) by mouth 2 (two) times daily. 07/07/24  Yes Teddy Sharper, FNP  oxyCODONE -acetaminophen  (PERCOCET/ROXICET) 5-325 MG tablet Take 1 tablet by mouth every 8 (eight) hours as needed for up to 4 days for severe pain (pain score 7-10). 07/07/24 07/11/24 Yes Teddy Sharper, FNP  ferrous  sulfate 325 (65 FE) MG EC tablet Take 1 tablet (325 mg total) by mouth every other day. 06/04/21   Malvina Ellen, MD    Family History Family History  Problem Relation Age of Onset   Healthy Mother    Healthy Father    Diabetes Sister     Social History Social History[1]   Allergies   Amoxicillin, Penicillins, and Vinegar [acetic acid]   Review of Systems Review of Systems   Physical Exam Triage Vital Signs ED Triage Vitals  Encounter Vitals Group     BP 07/07/24 1619 115/74     Girls Systolic BP  Percentile --      Girls Diastolic BP Percentile --      Boys Systolic BP Percentile --      Boys Diastolic BP Percentile --      Pulse Rate 07/07/24 1619 71     Resp 07/07/24 1619 17     Temp 07/07/24 1619 98.3 F (36.8 C)     Temp Source 07/07/24 1619 Oral     SpO2 07/07/24 1619 98 %     Weight 07/07/24 1616 143 lb (64.9 kg)     Height 07/07/24 1616 5' 4 (1.626 m)     Head Circumference --      Peak Flow --      Pain Score 07/07/24 1616 4     Pain Loc --      Pain Education --      Exclude from Growth Chart --    No data found.  Updated Vital Signs BP 115/74 (BP Location: Right Arm)   Pulse 71   Temp 98.3 F (36.8 C) (Oral)   Resp 17   Ht 5' 4 (1.626 m)   Wt 143 lb (64.9 kg)   LMP 07/04/2024 (Exact Date)   SpO2 98%   BMI 24.55 kg/m     Physical Exam Vitals and nursing note reviewed.  Constitutional:      Appearance: Normal appearance. She is normal weight. She is not ill-appearing.  HENT:     Head: Normocephalic and atraumatic.     Mouth/Throat:     Mouth: Mucous membranes are moist.     Pharynx: Oropharynx is clear.     Comments: Upper maxillary (Left third molar #16 lateral aspect): tooth cracked in half with erythematous, indurated gingival mucosa noted Eyes:     Extraocular Movements: Extraocular movements intact.     Conjunctiva/sclera: Conjunctivae normal.     Pupils: Pupils are equal, round, and reactive to light.  Cardiovascular:      Rate and Rhythm: Normal rate and regular rhythm.     Heart sounds: Normal heart sounds.  Pulmonary:     Effort: Pulmonary effort is normal.     Breath sounds: Normal breath sounds. No wheezing, rhonchi or rales.  Musculoskeletal:        General: Normal range of motion.  Skin:    General: Skin is warm and dry.  Neurological:     General: No focal deficit present.     Mental Status: She is alert and oriented to person, place, and time. Mental status is at baseline.  Psychiatric:        Mood and Affect: Mood normal.        Behavior: Behavior normal.      UC Treatments / Results  Labs (all labs ordered are listed, but only abnormal results are displayed) Labs Reviewed - No data to display  EKG   Radiology No results found.  Procedures Procedures (including critical care time)  Medications Ordered in UC Medications - No data to display  Initial Impression / Assessment and Plan / UC Course  I have reviewed the triage vital signs and the nursing notes.  Pertinent labs & imaging results that were available during my care of the patient were reviewed by me and considered in my medical decision making (see chart for details).     MDM: 1.  Abscess, dental-Rx doxycycline 100 mg capsule: Take 1 capsule twice daily x 10 days; 2.  Pain, dental-Rx'd Percocet 5/325 mg tablet: Take 1 tablet every 8 hours, as needed  for severe dental pain; 3.  Cracked tooth-Rx'd Percocet 5/325 mg tablet: Take 1 tablet every 8 hours, as needed for severe dental pain. Advised patient to take medication as directed with food to completion.  Advised may take Percocet daily or as needed for accompanying dental pain.  Patient is not been advised of sedative effects of this medication.  Encouraged increase daily water intake to 64 ounces per day while taking these medications.  Advised patient to follow-up with her dentist on Monday, 07/10/2024 for further evaluation of left upper third molar #16.  Patient  discharged home, hemodynamically stable. Final Clinical Impressions(s) / UC Diagnoses   Final diagnoses:  Pain, dental  Cracked tooth  Abscess, dental     Discharge Instructions      Advised patient to take medication as directed with food to completion.  Advised may take Percocet daily or as needed for accompanying dental pain.  Patient is not been advised of sedative effects of this medication.  Encouraged increase daily water intake to 64 ounces per day while taking these medications.  Advised patient to follow-up with her dentist on Monday, 07/10/2024 for further evaluation of left upper third molar #16.     ED Prescriptions     Medication Sig Dispense Auth. Provider   doxycycline (VIBRAMYCIN) 100 MG capsule Take 1 capsule (100 mg total) by mouth 2 (two) times daily. 20 capsule Stellar Gensel, FNP   oxyCODONE -acetaminophen  (PERCOCET/ROXICET) 5-325 MG tablet Take 1 tablet by mouth every 8 (eight) hours as needed for up to 4 days for severe pain (pain score 7-10). 12 tablet Codee Bloodworth, FNP      I have reviewed the PDMP during this encounter.    [1]  Social History Tobacco Use   Smoking status: Some Days    Types: Cigars    Last attempt to quit: 07/27/2016    Years since quitting: 7.9   Smokeless tobacco: Never  Substance Use Topics   Alcohol use: No    Comment: quit with +preg   Drug use: Yes    Types: Marijuana    Comment: Stopped once pregnant     Teddy Sharper, FNP 07/07/24 1700

## 2024-07-07 NOTE — Discharge Instructions (Addendum)
 Advised patient to take medication as directed with food to completion.  Advised may take Percocet daily or as needed for accompanying dental pain.  Patient is not been advised of sedative effects of this medication.  Encouraged increase daily water intake to 64 ounces per day while taking these medications.  Advised patient to follow-up with her dentist on Monday, 07/10/2024 for further evaluation of left upper third molar #16.
